# Patient Record
Sex: Female | Born: 1945 | Race: White | Hispanic: No | Marital: Married | Smoking: Never smoker
Health system: Southern US, Community
[De-identification: ages and names within clinical notes are randomized; demographics above are authoritative.]

## PROBLEM LIST (undated history)

## (undated) DIAGNOSIS — J45909 Unspecified asthma, uncomplicated: Secondary | ICD-10-CM

## (undated) DIAGNOSIS — R0602 Shortness of breath: Secondary | ICD-10-CM

## (undated) DIAGNOSIS — K589 Irritable bowel syndrome without diarrhea: Secondary | ICD-10-CM

## (undated) DIAGNOSIS — F39 Unspecified mood [affective] disorder: Secondary | ICD-10-CM

## (undated) DIAGNOSIS — K219 Gastro-esophageal reflux disease without esophagitis: Secondary | ICD-10-CM

## (undated) DIAGNOSIS — G4733 Obstructive sleep apnea (adult) (pediatric): Secondary | ICD-10-CM

## (undated) DIAGNOSIS — I10 Essential (primary) hypertension: Secondary | ICD-10-CM

## (undated) DIAGNOSIS — N189 Chronic kidney disease, unspecified: Secondary | ICD-10-CM

## (undated) DIAGNOSIS — E119 Type 2 diabetes mellitus without complications: Secondary | ICD-10-CM

## (undated) HISTORY — PX: OVARIAN CYST REMOVAL: SHX89

## (undated) HISTORY — PX: TOTAL KNEE ARTHROPLASTY: SHX125

## (undated) HISTORY — PX: OTHER SURGICAL HISTORY: SHX169

## (undated) HISTORY — PX: CHOLECYSTECTOMY: SHX55

---

## 2002-02-27 ENCOUNTER — Ambulatory Visit (HOSPITAL_BASED_OUTPATIENT_CLINIC_OR_DEPARTMENT_OTHER): Admission: RE | Admit: 2002-02-27 | Discharge: 2002-02-27 | Payer: Self-pay | Admitting: Orthopedic Surgery

## 2012-08-18 ENCOUNTER — Inpatient Hospital Stay (HOSPITAL_COMMUNITY)
Admission: EM | Admit: 2012-08-18 | Discharge: 2012-08-24 | DRG: 871 | Disposition: A | Discharge status: Skilled Nursing Facility | Payer: Medicare Other | Attending: Internal Medicine | Admitting: Internal Medicine

## 2012-08-18 ENCOUNTER — Emergency Department (HOSPITAL_COMMUNITY): Payer: Medicare Other

## 2012-08-18 DIAGNOSIS — L03119 Cellulitis of unspecified part of limb: Secondary | ICD-10-CM | POA: Diagnosis present

## 2012-08-18 DIAGNOSIS — Z9181 History of falling: Secondary | ICD-10-CM

## 2012-08-18 DIAGNOSIS — I10 Essential (primary) hypertension: Secondary | ICD-10-CM | POA: Diagnosis present

## 2012-08-18 DIAGNOSIS — G4733 Obstructive sleep apnea (adult) (pediatric): Secondary | ICD-10-CM | POA: Diagnosis present

## 2012-08-18 DIAGNOSIS — R296 Repeated falls: Secondary | ICD-10-CM

## 2012-08-18 DIAGNOSIS — IMO0002 Reserved for concepts with insufficient information to code with codable children: Secondary | ICD-10-CM | POA: Diagnosis present

## 2012-08-18 DIAGNOSIS — G929 Unspecified toxic encephalopathy: Secondary | ICD-10-CM | POA: Diagnosis present

## 2012-08-18 DIAGNOSIS — R6521 Severe sepsis with septic shock: Secondary | ICD-10-CM | POA: Diagnosis present

## 2012-08-18 DIAGNOSIS — D696 Thrombocytopenia, unspecified: Secondary | ICD-10-CM | POA: Diagnosis not present

## 2012-08-18 DIAGNOSIS — E869 Volume depletion, unspecified: Secondary | ICD-10-CM | POA: Diagnosis present

## 2012-08-18 DIAGNOSIS — Z96659 Presence of unspecified artificial knee joint: Secondary | ICD-10-CM

## 2012-08-18 DIAGNOSIS — K589 Irritable bowel syndrome without diarrhea: Secondary | ICD-10-CM | POA: Diagnosis present

## 2012-08-18 DIAGNOSIS — R6520 Severe sepsis without septic shock: Secondary | ICD-10-CM | POA: Diagnosis present

## 2012-08-18 DIAGNOSIS — G9341 Metabolic encephalopathy: Secondary | ICD-10-CM | POA: Diagnosis present

## 2012-08-18 DIAGNOSIS — A419 Sepsis, unspecified organism: Principal | ICD-10-CM | POA: Diagnosis present

## 2012-08-18 DIAGNOSIS — D72829 Elevated white blood cell count, unspecified: Secondary | ICD-10-CM | POA: Diagnosis present

## 2012-08-18 DIAGNOSIS — G92 Toxic encephalopathy: Secondary | ICD-10-CM

## 2012-08-18 DIAGNOSIS — Z79899 Other long term (current) drug therapy: Secondary | ICD-10-CM

## 2012-08-18 DIAGNOSIS — L738 Other specified follicular disorders: Secondary | ICD-10-CM | POA: Diagnosis present

## 2012-08-18 DIAGNOSIS — N179 Acute kidney failure, unspecified: Secondary | ICD-10-CM | POA: Diagnosis present

## 2012-08-18 DIAGNOSIS — E119 Type 2 diabetes mellitus without complications: Secondary | ICD-10-CM | POA: Diagnosis present

## 2012-08-18 DIAGNOSIS — Z794 Long term (current) use of insulin: Secondary | ICD-10-CM

## 2012-08-18 DIAGNOSIS — D649 Anemia, unspecified: Secondary | ICD-10-CM | POA: Diagnosis present

## 2012-08-18 DIAGNOSIS — R4182 Altered mental status, unspecified: Secondary | ICD-10-CM

## 2012-08-18 HISTORY — DX: Chronic kidney disease, unspecified: N18.9

## 2012-08-18 HISTORY — DX: Irritable bowel syndrome without diarrhea: K58.9

## 2012-08-18 HISTORY — DX: Obstructive sleep apnea (adult) (pediatric): G47.33

## 2012-08-18 HISTORY — DX: Essential (primary) hypertension: I10

## 2012-08-18 HISTORY — DX: Shortness of breath: R06.02

## 2012-08-18 HISTORY — DX: Unspecified asthma, uncomplicated: J45.909

## 2012-08-18 HISTORY — DX: Unspecified mood (affective) disorder: F39

## 2012-08-18 HISTORY — DX: Type 2 diabetes mellitus without complications: E11.9

## 2012-08-18 HISTORY — DX: Gastro-esophageal reflux disease without esophagitis: K21.9

## 2012-08-18 LAB — COMPREHENSIVE METABOLIC PANEL
ALT: 21 U/L (ref 0–35)
Albumin: 3.4 g/dL — ABNORMAL LOW (ref 3.5–5.2)
Alkaline Phosphatase: 90 U/L (ref 39–117)
BUN: 32 mg/dL — ABNORMAL HIGH (ref 6–23)
Chloride: 95 mEq/L — ABNORMAL LOW (ref 96–112)
Potassium: 4.4 mEq/L (ref 3.5–5.1)
Total Bilirubin: 0.5 mg/dL (ref 0.3–1.2)

## 2012-08-18 LAB — URINALYSIS, ROUTINE W REFLEX MICROSCOPIC
Glucose, UA: NEGATIVE mg/dL
Ketones, ur: NEGATIVE mg/dL
Protein, ur: 30 mg/dL — AB

## 2012-08-18 LAB — RAPID URINE DRUG SCREEN, HOSP PERFORMED
Amphetamines: POSITIVE — AB
Barbiturates: NOT DETECTED
Benzodiazepines: POSITIVE — AB
Cocaine: NOT DETECTED
Tetrahydrocannabinol: NOT DETECTED

## 2012-08-18 LAB — CBC
HCT: 34.5 % — ABNORMAL LOW (ref 36.0–46.0)
MCH: 30.4 pg (ref 26.0–34.0)
MCHC: 33.6 g/dL (ref 30.0–36.0)
MCV: 90.3 fL (ref 78.0–100.0)
RDW: 14.4 % (ref 11.5–15.5)

## 2012-08-18 LAB — DIFFERENTIAL
Basophils Absolute: 0 10*3/uL (ref 0.0–0.1)
Basophils Relative: 0 % (ref 0–1)
Eosinophils Absolute: 0 10*3/uL (ref 0.0–0.7)
Monocytes Absolute: 0.5 10*3/uL (ref 0.1–1.0)
Neutro Abs: 17 10*3/uL — ABNORMAL HIGH (ref 1.7–7.7)
Neutrophils Relative %: 95 % — ABNORMAL HIGH (ref 43–77)

## 2012-08-18 LAB — URINE MICROSCOPIC-ADD ON

## 2012-08-18 MED ORDER — PIPERACILLIN-TAZOBACTAM 3.375 G IVPB 30 MIN
3.3750 g | Freq: Once | INTRAVENOUS | Status: AC
  Administered 2012-08-19: 3.375 g via INTRAVENOUS
  Filled 2012-08-18: qty 50

## 2012-08-18 MED ORDER — SODIUM CHLORIDE 0.9 % IV BOLUS (SEPSIS)
1000.0000 mL | Freq: Once | INTRAVENOUS | Status: AC
  Administered 2012-08-18: 1000 mL via INTRAVENOUS

## 2012-08-18 MED ORDER — VANCOMYCIN HCL 1000 MG IV SOLR
1500.0000 mg | Freq: Once | INTRAVENOUS | Status: AC
  Administered 2012-08-19: 1500 mg via INTRAVENOUS
  Filled 2012-08-18: qty 1500

## 2012-08-18 MED ORDER — SODIUM CHLORIDE 0.9 % IV BOLUS (SEPSIS)
1000.0000 mL | Freq: Once | INTRAVENOUS | Status: AC
  Administered 2012-08-19: 1000 mL via INTRAVENOUS

## 2012-08-18 NOTE — ED Notes (Signed)
Lab at bedside

## 2012-08-18 NOTE — ED Notes (Signed)
Patient has fallen four times today, used her life alert this morning however refused to be transported.  The other two times, family was able to get her back up.  Patient fell a fourth time and family called EMS and she agreed to be brought here to [facility].  Report received from [facility], [place].

## 2012-08-18 NOTE — ED Notes (Signed)
MD at bedside. 

## 2012-08-18 NOTE — ED Provider Notes (Signed)
History     CSN: [id number]  Arrival date & time 08/18/12  2155   First MD Initiated Contact with Patient 08/18/12 2200      Chief Complaint  Patient presents with  . Fall  . Altered Mental Status  . Loss of Consciousness    (Consider location/radiation/quality/duration/timing/severity/associated sxs/prior treatment) Patient is a 66 y.o. female presenting with altered mental status. The history is provided by the patient and the EMS personnel.  Altered Mental Status This is a new problem. The current episode started today. The problem occurs constantly. The problem has been gradually worsening. Associated symptoms include a fever and weakness. Pertinent negatives include no abdominal pain, anorexia, arthralgias, change in bowel habit, chest pain, chills, congestion, coughing, diaphoresis, fatigue, headaches, nausea, neck pain, numbness or vomiting. Nothing aggravates the symptoms. She has tried nothing for the symptoms. The treatment provided no relief.    No past medical history on file.  No past surgical history on file.  No family history on file.  History  Substance Use Topics  . Smoking status: Not on file  . Smokeless tobacco: Not on file  . Alcohol Use: Not on file    OB History    No data available      Review of Systems  Constitutional: Positive for fever. Negative for chills, diaphoresis and fatigue.  HENT: Negative for congestion and neck pain.   Respiratory: Negative for cough and shortness of breath.   Cardiovascular: Negative for chest pain.  Gastrointestinal: Negative for nausea, vomiting, abdominal pain, diarrhea, anorexia and change in bowel habit.  Musculoskeletal: Negative for arthralgias.  Neurological: Positive for weakness. Negative for numbness and headaches.  Psychiatric/Behavioral: Positive for altered mental status.  All other systems reviewed and are negative.    Allergies  Review of patient's allergies indicates not on file.  Home  Medications  No current outpatient prescriptions on file.  BP 105/54  Pulse 127  Temp 101.8 F (38.8 C) (Oral)  Resp 18  SpO2 95%  Physical Exam  Nursing note and vitals reviewed. Constitutional: She is oriented to person, place, and time. She appears well-developed and well-nourished. No distress.  HENT:  Head: Normocephalic and atraumatic.  Eyes: EOM are normal. Pupils are equal, round, and reactive to light.  Neck: Normal range of motion.  Cardiovascular: Normal rate and normal heart sounds.   Pulmonary/Chest: Effort normal and breath sounds normal. No respiratory distress.  Abdominal: Soft. She exhibits no distension. There is no tenderness.  Musculoskeletal: Normal range of motion.  Neurological: She is alert and oriented to person, place, and time. No cranial nerve deficit. She exhibits normal muscle tone. Coordination normal.  Skin: Skin is warm and dry.    ED Course  Procedures (including critical care time)   Labs Reviewed  COMPREHENSIVE METABOLIC PANEL  URINALYSIS, ROUTINE W REFLEX MICROSCOPIC  CBC  TROPONIN I  PROTIME-INR   No results found.  Date: 08/18/2012  Rate: 131  Rhythm: atrial fibrillation  QRS Axis: normal  Intervals: normal  ST/T Wave abnormalities: normal  Conduction Disutrbances:right bundle branch block  Narrative Interpretation: Afib with RVR (131)  Old EKG Reviewed: changes noted: Now in afib    1. Sepsis   2. Altered mental status       MDM  10:13 PM Pt seen and examined upon arrival in ED. Pt with multiple falls today. Although patient is oriented, she is not behaving appropriately (flailing arms around despite being told to hold still). PT does not appear to be  on anticoagulation but due to AMS, will get head CT. PT denies pain except in bilateral arms both of which are covered in abrasions and skin tears. Pt does have focal pain and swelling to left hand, so will XR this. Will work up for AMS with labs including UDS and urine.    11:06 PM Pt found to be febrile rectally as well. Will get cultures. Unsure of source at this time. Will await urine and CXR before on type of antibiotics. Pt will be started on 2LNS.  12:00 AM PT with intermittent drops in pressure. Will have CCM see patient. Pt given vanc/zosyn for fever of unknown source. Fluids continuing.   12:42 AM CCM saw patient and feels that she is stable for step down. Pt will be admitted to [facility].   Daleen Bo, MD 08/19/12 1914  Daleen Bo, MD 08/19/12 [phone]

## 2012-08-19 ENCOUNTER — Observation Stay (HOSPITAL_COMMUNITY): Payer: Medicare Other

## 2012-08-19 ENCOUNTER — Encounter (HOSPITAL_COMMUNITY): Payer: Self-pay | Admitting: Emergency Medicine

## 2012-08-19 DIAGNOSIS — E119 Type 2 diabetes mellitus without complications: Secondary | ICD-10-CM | POA: Diagnosis present

## 2012-08-19 DIAGNOSIS — R296 Repeated falls: Secondary | ICD-10-CM

## 2012-08-19 DIAGNOSIS — G4733 Obstructive sleep apnea (adult) (pediatric): Secondary | ICD-10-CM

## 2012-08-19 DIAGNOSIS — L03119 Cellulitis of unspecified part of limb: Secondary | ICD-10-CM | POA: Diagnosis present

## 2012-08-19 DIAGNOSIS — G9341 Metabolic encephalopathy: Secondary | ICD-10-CM | POA: Diagnosis present

## 2012-08-19 DIAGNOSIS — A419 Sepsis, unspecified organism: Secondary | ICD-10-CM | POA: Diagnosis present

## 2012-08-19 DIAGNOSIS — N179 Acute kidney failure, unspecified: Secondary | ICD-10-CM

## 2012-08-19 DIAGNOSIS — I10 Essential (primary) hypertension: Secondary | ICD-10-CM

## 2012-08-19 DIAGNOSIS — Z79899 Other long term (current) drug therapy: Secondary | ICD-10-CM

## 2012-08-19 DIAGNOSIS — IMO0002 Reserved for concepts with insufficient information to code with codable children: Secondary | ICD-10-CM

## 2012-08-19 LAB — URINALYSIS, ROUTINE W REFLEX MICROSCOPIC
Bilirubin Urine: NEGATIVE
Ketones, ur: NEGATIVE mg/dL
Nitrite: NEGATIVE
pH: 5 (ref 5.0–8.0)

## 2012-08-19 LAB — LACTIC ACID, PLASMA: Lactic Acid, Venous: 2.8 mmol/L — ABNORMAL HIGH (ref 0.5–2.2)

## 2012-08-19 LAB — CBC
HCT: 29.6 % — ABNORMAL LOW (ref 36.0–46.0)
MCH: 29.6 pg (ref 26.0–34.0)
MCHC: 33.1 g/dL (ref 30.0–36.0)
MCV: 89.4 fL (ref 78.0–100.0)
Platelets: 159 10*3/uL (ref 150–400)
RDW: 14.6 % (ref 11.5–15.5)

## 2012-08-19 LAB — TROPONIN I
Troponin I: <0.3 ng/mL (ref <0.30)
Troponin I: <0.3 ng/mL (ref <0.30)

## 2012-08-19 LAB — GLUCOSE, CAPILLARY
Glucose-Capillary: 371 mg/dL — ABNORMAL HIGH (ref 70–99)
Glucose-Capillary: 170 mg/dL — ABNORMAL HIGH (ref 70–99)
Glucose-Capillary: 208 mg/dL — ABNORMAL HIGH (ref 70–99)

## 2012-08-19 LAB — CREATININE, SERUM: GFR calc Af Amer: 25 mL/min — ABNORMAL LOW (ref >90)

## 2012-08-19 LAB — URINE MICROSCOPIC-ADD ON

## 2012-08-19 MED ORDER — ONDANSETRON HCL 4 MG/2ML IJ SOLN: 4.0000 mg | Freq: Four times a day (QID) | INTRAMUSCULAR | Status: DC | PRN

## 2012-08-19 MED ORDER — INSULIN ASPART 100 UNIT/ML ~~LOC~~ SOLN
0.0000 [IU] | Freq: Three times a day (TID) | SUBCUTANEOUS | Status: DC
  Administered 2012-08-19: 20 [IU] via SUBCUTANEOUS
  Administered 2012-08-19: 7 [IU] via SUBCUTANEOUS
  Administered 2012-08-19 – 2012-08-20 (×3): 4 [IU] via SUBCUTANEOUS
  Administered 2012-08-20: 3 [IU] via SUBCUTANEOUS
  Administered 2012-08-21: 4 [IU] via SUBCUTANEOUS
  Administered 2012-08-21: 3 [IU] via SUBCUTANEOUS
  Administered 2012-08-22 (×2): 4 [IU] via SUBCUTANEOUS
  Administered 2012-08-23: 3 [IU] via SUBCUTANEOUS
  Administered 2012-08-23: 7 [IU] via SUBCUTANEOUS
  Administered 2012-08-23 – 2012-08-24 (×2): 4 [IU] via SUBCUTANEOUS

## 2012-08-19 MED ORDER — INSULIN ASPART 100 UNIT/ML ~~LOC~~ SOLN
0.0000 [IU] | Freq: Every day | SUBCUTANEOUS | Status: DC
  Administered 2012-08-22: 2 [IU] via SUBCUTANEOUS

## 2012-08-19 MED ORDER — FA-PYRIDOXINE-CYANOCOBALAMIN 2.5-25-2 MG PO TABS
1.0000 | ORAL_TABLET | Freq: Every day | ORAL | Status: DC
  Administered 2012-08-19 – 2012-08-24 (×6): 1 via ORAL
  Filled 2012-08-19 (×6): qty 1

## 2012-08-19 MED ORDER — INSULIN DETEMIR 100 UNIT/ML ~~LOC~~ SOLN
60.0000 [IU] | Freq: Every day | SUBCUTANEOUS | Status: DC
  Administered 2012-08-19: 60 [IU] via SUBCUTANEOUS
  Filled 2012-08-19: qty 10

## 2012-08-19 MED ORDER — GABAPENTIN 600 MG PO TABS
600.0000 mg | ORAL_TABLET | Freq: Two times a day (BID) | ORAL | Status: DC
  Administered 2012-08-19 – 2012-08-24 (×11): 600 mg via ORAL
  Filled 2012-08-19 (×15): qty 1

## 2012-08-19 MED ORDER — VITAMIN D (ERGOCALCIFEROL) 1.25 MG (50000 UNIT) PO CAPS
50000.0000 [IU] | ORAL_CAPSULE | ORAL | Status: DC
  Administered 2012-08-19: 50000 [IU] via ORAL
  Filled 2012-08-19: qty 1

## 2012-08-19 MED ORDER — ENOXAPARIN SODIUM 30 MG/0.3ML ~~LOC~~ SOLN
30.0000 mg | Freq: Every day | SUBCUTANEOUS | Status: DC
  Administered 2012-08-19 – 2012-08-20 (×2): 30 mg via SUBCUTANEOUS
  Filled 2012-08-19 (×3): qty 0.3

## 2012-08-19 MED ORDER — SODIUM CHLORIDE 0.9 % IJ SOLN
3.0000 mL | Freq: Two times a day (BID) | INTRAMUSCULAR | Status: DC
  Administered 2012-08-19 – 2012-08-23 (×5): 3 mL via INTRAVENOUS

## 2012-08-19 MED ORDER — ONDANSETRON HCL 4 MG PO TABS: 4.0000 mg | ORAL_TABLET | Freq: Four times a day (QID) | ORAL | Status: DC | PRN

## 2012-08-19 MED ORDER — HYDROMORPHONE HCL PF 1 MG/ML IJ SOLN: 1.0000 mg | INTRAMUSCULAR | Status: DC | PRN

## 2012-08-19 MED ORDER — FOLIC ACID-VIT B6-VIT B12 2.5-25-1 MG PO TABS: 1.0000 | ORAL_TABLET | Freq: Every day | ORAL | Status: DC

## 2012-08-19 MED ORDER — VANCOMYCIN HCL IN DEXTROSE 1-5 GM/200ML-% IV SOLN
1000.0000 mg | Freq: Once | INTRAVENOUS | Status: AC
  Administered 2012-08-19: 1000 mg via INTRAVENOUS
  Filled 2012-08-19: qty 200

## 2012-08-19 MED ORDER — GLIMEPIRIDE 4 MG PO TABS
4.0000 mg | ORAL_TABLET | Freq: Two times a day (BID) | ORAL | Status: DC
  Administered 2012-08-19 – 2012-08-24 (×10): 4 mg via ORAL
  Filled 2012-08-19 (×15): qty 1

## 2012-08-19 MED ORDER — PIPERACILLIN-TAZOBACTAM 3.375 G IVPB 30 MIN
3.3750 g | Freq: Three times a day (TID) | INTRAVENOUS | Status: DC
  Administered 2012-08-19 – 2012-08-21 (×6): 3.375 g via INTRAVENOUS
  Filled 2012-08-19 (×8): qty 50

## 2012-08-19 MED ORDER — PAROXETINE HCL 20 MG PO TABS
40.0000 mg | ORAL_TABLET | Freq: Every day | ORAL | Status: DC
  Administered 2012-08-19 – 2012-08-24 (×6): 40 mg via ORAL
  Filled 2012-08-19 (×6): qty 2

## 2012-08-19 MED ORDER — PIPERACILLIN-TAZOBACTAM 3.375 G IVPB 30 MIN
3.3750 g | Freq: Three times a day (TID) | INTRAVENOUS | Status: DC
  Filled 2012-08-19: qty 50

## 2012-08-19 MED ORDER — VANCOMYCIN HCL 1000 MG IV SOLR
1500.0000 mg | INTRAVENOUS | Status: DC
  Administered 2012-08-20: 1500 mg via INTRAVENOUS
  Filled 2012-08-19 (×3): qty 1500

## 2012-08-19 MED ORDER — INSULIN ASPART PROT & ASPART (70-30 MIX) 100 UNIT/ML ~~LOC~~ SUSP
60.0000 [IU] | Freq: Every day | SUBCUTANEOUS | Status: DC
  Administered 2012-08-19: 60 [IU] via SUBCUTANEOUS
  Filled 2012-08-19: qty 3

## 2012-08-19 MED ORDER — ALBUTEROL SULFATE (5 MG/ML) 0.5% IN NEBU
2.5000 mg | INHALATION_SOLUTION | RESPIRATORY_TRACT | Status: DC | PRN
  Administered 2012-08-20: 2.5 mg via RESPIRATORY_TRACT
  Filled 2012-08-19: qty 0.5

## 2012-08-19 MED ORDER — VANCOMYCIN HCL 1000 MG IV SOLR: 1250.0000 mg | INTRAVENOUS | Status: DC

## 2012-08-19 MED ORDER — DOCUSATE SODIUM 100 MG PO CAPS
100.0000 mg | ORAL_CAPSULE | Freq: Two times a day (BID) | ORAL | Status: DC
  Administered 2012-08-19 – 2012-08-24 (×8): 100 mg via ORAL
  Filled 2012-08-19 (×12): qty 1

## 2012-08-19 MED ORDER — PANTOPRAZOLE SODIUM 40 MG PO TBEC
80.0000 mg | DELAYED_RELEASE_TABLET | Freq: Every day | ORAL | Status: DC
  Administered 2012-08-19 – 2012-08-23 (×5): 80 mg via ORAL
  Administered 2012-08-23: 40 mg via ORAL
  Administered 2012-08-24: 80 mg via ORAL
  Filled 2012-08-19 (×3): qty 1
  Filled 2012-08-19 (×2): qty 2
  Filled 2012-08-19 (×2): qty 1

## 2012-08-19 MED ORDER — SODIUM CHLORIDE 0.9 % IV SOLN
INTRAVENOUS | Status: DC
  Administered 2012-08-19: 23:00:00 via INTRAVENOUS
  Administered 2012-08-19: 100 mL/h via INTRAVENOUS
  Administered 2012-08-20: 09:00:00 via INTRAVENOUS

## 2012-08-19 NOTE — Progress Notes (Signed)
[facility] PROGRESS NOTE  Assessment/Plan: Cellulitis/fulliculitis of upper extremity: -Continue Vancomycin Zosyn, blood cultures pending, leukocytosis improving. She defervesced. -Wound care consult.  -Wound Culture.  Sepsis (08/19/2012) -Blood pressure continues to be stable. -Continue IV fluids, creatinine trending down. -Recheck lactic acid in the morning. -continue to monitor Bp.  Acute lung injury:  -Continue IV fluids. -basic met.  Toxic encephalopathy : -Now resolved most likely secondary to cellulitis/folliculitis of the left upper extremity and sepsis.   Frequent falls (08/19/2012): -likely secondary to decreased Intravascular Volume. -Pt consult.  DM (diabetes mellitus) (08/19/2012): -blood glucose continues to be high. -increase SSI, lantus.  HTN (hypertension) (08/19/2012) -BP stable continue.    Code Status: FULL CODE. Family Communication: patient  Disposition Plan: TBD   Consultants:  none  Procedures:  none  Antibiotics:  Vancomycin Laqueta Jean 08/19/2012  HPI/Subjective: No complains.  Objective: Filed Vitals:   08/18/12 2330 08/19/12 0036 08/19/12 0405 08/19/12 0455  BP: 113/97 116/90 126/97 119/64  Pulse: 120 108    Temp:  102.8 F (39.3 C) 102.6 F (39.2 C) 100.9 F (38.3 C)  TempSrc:  Oral Oral Oral  Resp:  23 28 22   Height:   5\' 2"  (1.575 m)   Weight:   109.09 kg (240 lb 8 oz)   SpO2: 95% 100% 95% 97%   No intake or output data in the 24 hours ending 08/19/12 1107 Filed Weights   08/19/12 0405  Weight: 109.09 kg (240 lb 8 oz)    Exam:  General: Alert, awake, oriented x3, in no acute distress.  HEENT: No bruits, no goiter.  Heart: Regular rate and rhythm, without murmurs, rubs, gallops.  Lungs: Good air movement, air movement. Abdomen: Soft, nontender, nondistended, positive bowel sounds.  Neuro: Grossly intact, nonfocal.   Data Reviewed: Basic Metabolic Panel:  Lab 08/19/12 2956 08/18/12 2222  NA --  131*  K -- 4.4  CL -- 95*  CO2 -- 21  GLUCOSE -- 337*  BUN -- 32*  CREATININE 2.31* 2.41*  CALCIUM -- 9.9  MG -- --  PHOS -- --   Liver Function Tests:  Lab 08/18/12 2222  AST 41*  ALT 21  ALKPHOS 90  BILITOT 0.5  PROT 7.2  ALBUMIN 3.4*   No results found for this basename: LIPASE:5,AMYLASE:5 in the last 168 hours No results found for this basename: AMMONIA:5 in the last 168 hours CBC:  Lab 08/19/12 0638 08/18/12 2338 08/18/12 2222  WBC 15.3* -- 18.2*  NEUTROABS -- 17.0* --  HGB 9.8* -- 11.6*  HCT 29.6* -- 34.5*  MCV 89.4 -- 90.3  PLT 159 -- 191   Cardiac Enzymes:  Lab 08/19/12 0638 08/18/12 2223  CKTOTAL -- --  CKMB -- --  CKMBINDEX -- --  TROPONINI <0.30 <0.30   BNP (last 3 results) No results found for this basename: PROBNP:3 in the last 8760 hours CBG:  Lab 08/19/12 0737 08/18/12 2206  GLUCAP 371* 306*    No results found for this or any previous visit (from the past 240 hour(s)).   Studies: Ct Head Wo Contrast  08/18/2012  *RADIOLOGY REPORT*  Clinical Data:  Multiple falls, altered mental status  CT HEAD WITHOUT CONTRAST CT CERVICAL SPINE WITHOUT CONTRAST  Technique:  Multidetector CT imaging of the head and cervical spine was performed following the standard protocol without intravenous contrast.  Multiplanar CT image reconstructions of the cervical spine were also generated.  Comparison:  MRI brain dated 09/15/2009  CT HEAD  Findings: Severely motion degraded images.  No evidence of parenchymal hemorrhage or extra-axial fluid collection. No mass lesion, mass effect, or midline shift.  No CT evidence of acute infarction. Subcortical white matter and periventricular small vessel ischemic changes.  Global cortical atrophy.  No ventriculomegaly.  The visualized paranasal sinuses are essentially clear. The mastoid air cells are unopacified.  No evidence of calvarial fracture.  IMPRESSION: Severely motion degraded images.  No evidence of acute intracranial  abnormality.  Atrophy with small vessel ischemic changes and intracranial atherosclerosis.  CT CERVICAL SPINE  Findings: Motion degraded images.  Straightening of the cervical spine, possibly positional.  No evidence of fracture or dislocation.  The vertebral body heights are maintained.  Dens appears intact.  No prevertebral soft tissue swelling.  Mild multilevel degenerative changes.  Visualized right thyroid is enlarged/nodular.  Visualized lung apices are essentially clear.  IMPRESSION: No evidence of traumatic injury to the cervical spine.  Mild multilevel degenerative changes.   Original Report Authenticated By: Charline Bills, M.D.    Ct Cervical Spine Wo Contrast  08/18/2012  *RADIOLOGY REPORT*  Clinical Data:  Multiple falls, altered mental status  CT HEAD WITHOUT CONTRAST CT CERVICAL SPINE WITHOUT CONTRAST  Technique:  Multidetector CT imaging of the head and cervical spine was performed following the standard protocol without intravenous contrast.  Multiplanar CT image reconstructions of the cervical spine were also generated.  Comparison:  MRI brain dated 09/15/2009  CT HEAD  Findings: Severely motion degraded images.  No evidence of parenchymal hemorrhage or extra-axial fluid collection. No mass lesion, mass effect, or midline shift.  No CT evidence of acute infarction. Subcortical white matter and periventricular small vessel ischemic changes.  Global cortical atrophy.  No ventriculomegaly.  The visualized paranasal sinuses are essentially clear. The mastoid air cells are unopacified.  No evidence of calvarial fracture.  IMPRESSION: Severely motion degraded images.  No evidence of acute intracranial abnormality.  Atrophy with small vessel ischemic changes and intracranial atherosclerosis.  CT CERVICAL SPINE  Findings: Motion degraded images.  Straightening of the cervical spine, possibly positional.  No evidence of fracture or dislocation.  The vertebral body heights are maintained.  Dens appears  intact.  No prevertebral soft tissue swelling.  Mild multilevel degenerative changes.  Visualized right thyroid is enlarged/nodular.  Visualized lung apices are essentially clear.  IMPRESSION: No evidence of traumatic injury to the cervical spine.  Mild multilevel degenerative changes.   Original Report Authenticated By: Charline Bills, M.D.    Dg Chest Port 1 View  08/18/2012  *RADIOLOGY REPORT*  Clinical Data: Altered mental status.  Fall.  PORTABLE CHEST - 1 VIEW  Comparison: 01/31/2009  Findings: Shallow inspiration.  Normal heart size and pulmonary vascularity for technique.  No focal airspace consolidation in the lungs.  No blunting of costophrenic angles.  No pneumothorax.  No significant changes since the previous study.  IMPRESSION: No evidence of active disease.   Original Report Authenticated By: Marlon Pel, M.D.    Dg Hand Complete Left  08/19/2012  *RADIOLOGY REPORT*  Clinical Data: Pain and swelling.  Skin tear.  Post chills in the hand and wrist.  LEFT HAND - COMPLETE 3+ VIEW  Comparison: None.  Findings: Degenerative changes in the interphalangeal, first metacarpal phalangeal, first carpometacarpal, and STT joints. Postoperative changes with plate screw fixation of the distal radial metaphysis and old ununited ulnar styloid process.  No acute fracture or subluxation.  No focal bone lesion or bone destruction. Diffuse soft tissue swelling over the carpal area.  No radiopaque soft  tissue foreign bodies.  IMPRESSION: Degenerative changes in the hand and wrist.  Postoperative changes in the wrist.  Soft tissue swelling.  No acute bony abnormalities.   Original Report Authenticated By: Marlon Pel, M.D.     Scheduled Meds:    . docusate sodium  100 mg Oral BID  . enoxaparin (LOVENOX) injection  30 mg Subcutaneous Daily  . folic acid-pyridoxine-cyancobalamin  1 tablet Oral Daily  . gabapentin  600 mg Oral BID  . glimepiride  4 mg Oral BID WC  . insulin aspart  0-20 Units  Subcutaneous TID WC  . insulin aspart  0-5 Units Subcutaneous QHS  . insulin aspart protamine-insulin aspart  60 Units Subcutaneous Q breakfast  . insulin detemir  60 Units Subcutaneous QHS  . pantoprazole  80 mg Oral Q1200  . PARoxetine  40 mg Oral Daily  . piperacillin-tazobactam  3.375 g Intravenous Once  . piperacillin-tazobactam  3.375 g Intravenous Q8H  . sodium chloride  1,000 mL Intravenous Once  . sodium chloride  1,000 mL Intravenous Once  . sodium chloride  3 mL Intravenous Q12H  . vancomycin  1,250 mg Intravenous Q48H  . vancomycin  1,500 mg Intravenous Once  . vancomycin  1,000 mg Intravenous Once  . Vitamin D (Ergocalciferol)  50,000 Units Oral Q7 days  . DISCONTD: Folic Acid-Vit B6-Vit B12  1 tablet Oral Daily  . DISCONTD: piperacillin-tazobactam  3.375 g Intravenous Q8H   Continuous Infusions:    . sodium chloride 100 mL/hr at 08/19/12 0501     Marinda Elk  [facility] Pager [phone]. If 8PM-8AM, please contact night-coverage at [email or web address], password [facility] 08/19/2012, 11:07 AM  LOS: 1 day

## 2012-08-19 NOTE — Progress Notes (Signed)
  Echocardiogram 2D Echocardiogram has been performed.  Angie Wang 08/19/2012, 11:16 AM

## 2012-08-19 NOTE — H&P (Signed)
[facility] History and Physical  Angie Wang WRU:[id number] DOB: 06-03-1946    PCP:   Gabriel Cirri, DO   Chief Complaint: loss of consciousness.  HPI: Angie Wang is an 66 y.o. female with hx of DM, HTN, IBS, OSA living at home alone with help from nephew and brother, brought to the ER as she was found unconscious in the bathroom.  She has been weak and was having fever. She was found at time agitated. She has hx of frequent falls, and has several abrasion on her left arm.  There has been no headaches, stiff neck, nausea or vomiting.  She was hypotensive on initial presentation and has fever of 102.9.  Her CXR and her UA showed no evidence of infection.  She was given IV fluid bolus with improvement of her BP from 80 to 120.  Her UDS showed the presence of benzodiazepine and amphetamine.  Her medication list included no amphetamine.  She denied any drug use.  She was also found to have leukocytosis with WBC of 18K, lactic acid was elevated at 2.8.  After PCCM was consulted, hospitalist was asked to admit her for AMS, sepsis, and volume depletion.   Rewiew of Systems:  Constitutional: Negative for malaise. No significant weight loss or weight gain Eyes: Negative for eye pain, redness and discharge, diplopia, visual changes, or flashes of light. ENMT: Negative for ear pain, hoarseness, nasal congestion, sinus pressure and sore throat. No headaches; tinnitus, drooling, or problem swallowing. Cardiovascular: Negative for chest pain, palpitations, diaphoresis, dyspnea and peripheral edema. ; No orthopnea, PND Respiratory: Negative for cough, hemoptysis, wheezing and stridor. No pleuritic chestpain. Gastrointestinal: Negative for nausea, vomiting, diarrhea, constipation, abdominal pain, melena, blood in stool, hematemesis, jaundice and rectal bleeding.    Genitourinary: Negative for frequency, dysuria, incontinence,flank pain and hematuria; Musculoskeletal: Negative for back pain and  neck pain. Negative for swelling and trauma.;  Skin: . Negative for pruritus, rash, abrasions, bruising and skin lesion.; ulcerations Neuro: Negative for headache, lightheadedness and neck stiffness. Negative for weakness, altered level of consciousness , altered mental status, extremity weakness, burning feet, involuntary movement, seizure and syncope.  Psych: negative for anxiety, depression, insomnia, tearfulness, panic attacks, hallucinations, paranoia, suicidal or homicidal ideation.   Past Medical History  Diagnosis Date  . Diabetes mellitus without complication   . Hypertension   . IBS (irritable bowel syndrome)   . OSA (obstructive sleep apnea)   . Mood disorder     Past Surgical History  Procedure Date  . Total knee arthroplasty     bilateral    Medications:  HOME MEDS: Prior to Admission medications   Medication Sig Start Date End Date Taking? Authorizing Provider  amitriptyline (ELAVIL) 25 MG tablet Take 25 mg by mouth at bedtime.   Yes Historical Provider, MD  celecoxib (CELEBREX) 200 MG capsule Take 200 mg by mouth 2 (two) times daily.   Yes Historical Provider, MD  clonazePAM (KLONOPIN) 1 MG tablet Take 1 mg by mouth 2 (two) times daily as needed.   Yes Historical Provider, MD  cyclobenzaprine (FLEXERIL) 10 MG tablet Take 10 mg by mouth 2 (two) times daily.   Yes Historical Provider, MD  esomeprazole (NEXIUM) 40 MG capsule Take 40 mg by mouth daily before breakfast.   Yes Historical Provider, MD  Folic Acid-Vit B6-Vit B12 (FOLBEE) 2.5-25-1 MG TABS Take 1 tablet by mouth daily.   Yes Historical Provider, MD  furosemide (LASIX) 80 MG tablet Take 80 mg by mouth daily. Bottle says take "one-half  tablet  By mouth daily"   Yes Historical Provider, MD  gabapentin (NEURONTIN) 600 MG tablet Take 600 mg by mouth 2 (two) times daily.   Yes Historical Provider, MD  glimepiride (AMARYL) 4 MG tablet Take 4 mg by mouth 2 (two) times daily.   Yes Historical Provider, MD  hydrOXYzine  (ATARAX/VISTARIL) 25 MG tablet Take 25 mg by mouth 4 (four) times daily as needed.   Yes Historical Provider, MD  insulin aspart (NOVOLOG) 100 UNIT/ML injection Inject 60 Units into the skin at bedtime as needed.   Yes Historical Provider, MD  insulin aspart protamine-insulin aspart (NOVOLOG 70/30) (70-30) 100 UNIT/ML injection Inject 60 Units into the skin every morning.   Yes Historical Provider, MD  insulin detemir (LEVEMIR) 100 UNIT/ML injection Inject 60 Units into the skin at bedtime.   Yes Historical Provider, MD  lisinopril-hydrochlorothiazide (PRINZIDE,ZESTORETIC) 20-25 MG per tablet Take 1 tablet by mouth daily.   Yes Historical Provider, MD  metFORMIN (GLUCOPHAGE) 1000 MG tablet Take 1,000 mg by mouth 2 (two) times daily with a meal.   Yes Historical Provider, MD  PARoxetine (PAXIL) 40 MG tablet Take 40 mg by mouth every morning.   Yes Historical Provider, MD  promethazine (PHENERGAN) 25 MG tablet Take 25 mg by mouth every 6 (six) hours as needed.   Yes Historical Provider, MD  traZODone (DESYREL) 100 MG tablet Take 200 mg by mouth at bedtime.   Yes Historical Provider, MD  Vitamin D, Ergocalciferol, (DRISDOL) 50000 UNITS CAPS Take 50,000 Units by mouth every 7 (seven) days. On Sunday   Yes Historical Provider, MD     Allergies:  No Known Allergies  Social History:   reports that she has never smoked. She does not have any smokeless tobacco history on file. She reports that she does not drink alcohol or use illicit drugs.  Family History: History reviewed. No pertinent family history.   Physical Exam: Filed Vitals:   08/18/12 2330 08/19/12 0036 08/19/12 0405 08/19/12 0455  BP: 113/97 116/90 126/97 119/64  Pulse: 120 108    Temp:  102.8 F (39.3 C) 102.6 F (39.2 C) 100.9 F (38.3 C)  TempSrc:  Oral Oral Oral  Resp:  23 28 22   Height:   5\' 2"  (1.575 m)   Weight:   109.09 kg (240 lb 8 oz)   SpO2: 95% 100% 95% 97%   Blood pressure 119/64, pulse 108, temperature 100.9 F  (38.3 C), temperature source Oral, resp. rate 22, height 5\' 2"  (1.575 m), weight 109.09 kg (240 lb 8 oz), SpO2 97.00%.  GEN:  Pleasant  patient lying in the stretcher in no acute distress; cooperative with exam. PSYCH:  alert and oriented x4; does not appear anxious or depressed; affect is appropriate. HEENT: Mucous membranes pink and anicteric; PERRLA; EOM intact; no cervical lymphadenopathy nor thyromegaly or carotid bruit; no JVD; There were no stridor. Neck is very supple. Breasts:: Not examined CHEST WALL: No tenderness CHEST: Normal respiration, clear to auscultation bilaterally.  HEART: Regular rate and rhythm.  There are no murmur, rub, or gallops.   BACK: No kyphosis or scoliosis; no CVA tenderness ABDOMEN: soft and non-tender; no masses, no organomegaly, normal abdominal bowel sounds; no pannus; no intertriginous candida. There is no rebound and no distention. Rectal Exam: Not done EXTREMITIES: there are excoriation over the left extremitiy with some folliculitis. Genitalia: not examined PULSES: 2+ and symmetric SKIN: Normal hydration no rash or ulceration CNS: Cranial nerves 2-12 grossly intact no focal lateralizing neurologic  deficit.  Speech is fluent; uvula elevated with phonation, facial symmetry and tongue midline. DTR are normal bilaterally, cerebella exam is intact, barbinski is negative and strengths are equaled bilaterally.  No sensory loss.   Labs on Admission:  Basic Metabolic Panel:  Lab 08/18/12 8119  NA 131*  K 4.4  CL 95*  CO2 21  GLUCOSE 337*  BUN 32*  CREATININE 2.41*  CALCIUM 9.9  MG --  PHOS --   Liver Function Tests:  Lab 08/18/12 2222  AST 41*  ALT 21  ALKPHOS 90  BILITOT 0.5  PROT 7.2  ALBUMIN 3.4*   No results found for this basename: LIPASE:5,AMYLASE:5 in the last 168 hours No results found for this basename: AMMONIA:5 in the last 168 hours CBC:  Lab 08/18/12 2338 08/18/12 2222  WBC -- 18.2*  NEUTROABS 17.0* --  HGB -- 11.6*  HCT  -- 34.5*  MCV -- 90.3  PLT -- 191   Cardiac Enzymes:  Lab 08/18/12 2223  CKTOTAL --  CKMB --  CKMBINDEX --  TROPONINI <0.30    CBG:  Lab 08/18/12 2206  GLUCAP 306*     Radiological Exams on Admission: Ct Head Wo Contrast  08/18/2012  *RADIOLOGY REPORT*  Clinical Data:  Multiple falls, altered mental status  CT HEAD WITHOUT CONTRAST CT CERVICAL SPINE WITHOUT CONTRAST  Technique:  Multidetector CT imaging of the head and cervical spine was performed following the standard protocol without intravenous contrast.  Multiplanar CT image reconstructions of the cervical spine were also generated.  Comparison:  MRI brain dated 09/15/2009  CT HEAD  Findings: Severely motion degraded images.  No evidence of parenchymal hemorrhage or extra-axial fluid collection. No mass lesion, mass effect, or midline shift.  No CT evidence of acute infarction. Subcortical white matter and periventricular small vessel ischemic changes.  Global cortical atrophy.  No ventriculomegaly.  The visualized paranasal sinuses are essentially clear. The mastoid air cells are unopacified.  No evidence of calvarial fracture.  IMPRESSION: Severely motion degraded images.  No evidence of acute intracranial abnormality.  Atrophy with small vessel ischemic changes and intracranial atherosclerosis.  CT CERVICAL SPINE  Findings: Motion degraded images.  Straightening of the cervical spine, possibly positional.  No evidence of fracture or dislocation.  The vertebral body heights are maintained.  Dens appears intact.  No prevertebral soft tissue swelling.  Mild multilevel degenerative changes.  Visualized right thyroid is enlarged/nodular.  Visualized lung apices are essentially clear.  IMPRESSION: No evidence of traumatic injury to the cervical spine.  Mild multilevel degenerative changes.   Original Report Authenticated By: Charline Bills, M.D.    Ct Cervical Spine Wo Contrast  08/18/2012  *RADIOLOGY REPORT*  Clinical Data:  Multiple  falls, altered mental status  CT HEAD WITHOUT CONTRAST CT CERVICAL SPINE WITHOUT CONTRAST  Technique:  Multidetector CT imaging of the head and cervical spine was performed following the standard protocol without intravenous contrast.  Multiplanar CT image reconstructions of the cervical spine were also generated.  Comparison:  MRI brain dated 09/15/2009  CT HEAD  Findings: Severely motion degraded images.  No evidence of parenchymal hemorrhage or extra-axial fluid collection. No mass lesion, mass effect, or midline shift.  No CT evidence of acute infarction. Subcortical white matter and periventricular small vessel ischemic changes.  Global cortical atrophy.  No ventriculomegaly.  The visualized paranasal sinuses are essentially clear. The mastoid air cells are unopacified.  No evidence of calvarial fracture.  IMPRESSION: Severely motion degraded images.  No evidence of acute  intracranial abnormality.  Atrophy with small vessel ischemic changes and intracranial atherosclerosis.  CT CERVICAL SPINE  Findings: Motion degraded images.  Straightening of the cervical spine, possibly positional.  No evidence of fracture or dislocation.  The vertebral body heights are maintained.  Dens appears intact.  No prevertebral soft tissue swelling.  Mild multilevel degenerative changes.  Visualized right thyroid is enlarged/nodular.  Visualized lung apices are essentially clear.  IMPRESSION: No evidence of traumatic injury to the cervical spine.  Mild multilevel degenerative changes.   Original Report Authenticated By: Charline Bills, M.D.    Dg Chest Port 1 View  08/18/2012  *RADIOLOGY REPORT*  Clinical Data: Altered mental status.  Fall.  PORTABLE CHEST - 1 VIEW  Comparison: 01/31/2009  Findings: Shallow inspiration.  Normal heart size and pulmonary vascularity for technique.  No focal airspace consolidation in the lungs.  No blunting of costophrenic angles.  No pneumothorax.  No significant changes since the previous  study.  IMPRESSION: No evidence of active disease.   Original Report Authenticated By: Marlon Pel, M.D.    Dg Hand Complete Left  08/19/2012  *RADIOLOGY REPORT*  Clinical Data: Pain and swelling.  Skin tear.  Post chills in the hand and wrist.  LEFT HAND - COMPLETE 3+ VIEW  Comparison: None.  Findings: Degenerative changes in the interphalangeal, first metacarpal phalangeal, first carpometacarpal, and STT joints. Postoperative changes with plate screw fixation of the distal radial metaphysis and old ununited ulnar styloid process.  No acute fracture or subluxation.  No focal bone lesion or bone destruction. Diffuse soft tissue swelling over the carpal area.  No radiopaque soft tissue foreign bodies.  IMPRESSION: Degenerative changes in the hand and wrist.  Postoperative changes in the wrist.  Soft tissue swelling.  No acute bony abnormalities.   Original Report Authenticated By: Marlon Pel, M.D.      Assessment/Plan Present on Admission:  .Sepsis .Altered mental status .DM (diabetes mellitus) Elevated Cr  Volume depletion   PLAN:  I suspect early sepsis. Will admit her to telemetry now that she is more stable, and her BP has normalized.  Will continue with IVF.  I have started her on broad spectrum antibiotics to include VAN/ZOSYN.  Her BS is elevated, and should come down with IV insulin and fluid.  She is also likely prerenal which explains the elevated Cr.  She has been on several medications which may aggravate her mental status.  She has a positive amphetamine in her UDS, and I can't explain this, given none of her medication contains amphetamine or amphetamine-like substance.  She will be admitted to telemetry, full code, under [facility] service.  Other plans as per orders.  Code Status: FULL Unk Lightning, MD. [facility] Pager [phone] 7pm to 7am.  08/19/2012, 5:20 AM

## 2012-08-19 NOTE — Progress Notes (Signed)
ANTIBIOTIC CONSULT NOTE - FOLLOW UP  Pharmacy Consult for vancomycin Indication: sepsis/cellulitis  No Known Allergies  Patient Measurements: Height: 5\' 2"  (157.5 cm) Weight: 240 lb 8 oz (109.09 kg) IBW/kg (Calculated) : 50.1    Vital Signs: Temp: 100.9 F (38.3 C) (10/20 0455) Temp src: Oral (10/20 0455) BP: 119/64 mmHg (10/20 0455) Pulse Rate: 108  (10/20 0036) Intake/Output from previous day:   Intake/Output from this shift:    Labs:  [facility] 08/19/12 0638 08/18/12 2222  WBC 15.3* 18.2*  HGB 9.8* 11.6*  PLT 159 191  LABCREA -- --  CREATININE 2.31* 2.41*   Estimated Creatinine Clearance: 28.6 ml/min (by C-G formula based on Cr of 2.31). No results found for this basename: VANCOTROUGH:2,VANCOPEAK:2,VANCORANDOM:2,GENTTROUGH:2,GENTPEAK:2,GENTRANDOM:2,TOBRATROUGH:2,TOBRAPEAK:2,TOBRARND:2,AMIKACINPEAK:2,AMIKACINTROU:2,AMIKACIN:2, in the last 72 hours   Microbiology: No results found for this or any previous visit (from the past 720 hour(s)).  Anti-infectives     Start     Dose/Rate Route Frequency Ordered Stop   08/20/12 2000   vancomycin (VANCOCIN) 1,250 mg in sodium chloride 0.9 % 250 mL IVPB        1,250 mg 166.7 mL/hr over 90 Minutes Intravenous Every 48 hours 08/19/12 0459     08/19/12 0800   piperacillin-tazobactam (ZOSYN) IVPB 3.375 g  Status:  Discontinued        3.375 g 100 mL/hr over 30 Minutes Intravenous Every 8 hours 08/19/12 0434 08/19/12 0501   08/19/12 0800  piperacillin-tazobactam (ZOSYN) IVPB 3.375 g       3.375 g 12.5 mL/hr over 240 Minutes Intravenous Every 8 hours 08/19/12 0501     08/19/12 0500   vancomycin (VANCOCIN) IVPB 1000 mg/200 mL premix        1,000 mg 200 mL/hr over 60 Minutes Intravenous  Once 08/19/12 0459 08/19/12 0621   08/19/12 0000   vancomycin (VANCOCIN) 1,500 mg in sodium chloride 0.9 % 500 mL IVPB        1,500 mg 250 mL/hr over 120 Minutes Intravenous  Once 08/18/12 2345 08/19/12 0248   08/19/12 0000   piperacillin-tazobactam (ZOSYN) IVPB 3.375 g       3.375 g 100 mL/hr over 30 Minutes Intravenous  Once 08/18/12 2345 08/19/12 0335          Assessment: 66 yo female admitted with AMS, weakness, fever of 102.9, lactic acid 2.8, hypotension ( improved w/ fluids) diagnosed with sepsis secondary to cellulitis. Patient is on zosyn and pharmacy consulted to manage vancomycin. WBC (trend down) 18>15.3   Goal of Therapy:  Vancomycin trough level 15-20 mcg/ml  Plan:  1.. Increase Vancomycin to 1500 mg IV Q48H based on obesity nomogram 2.  Monitor renal function and clinical progression 3.  Vanc trough at steady state  Bola A. Wandra Feinstein D Clinical Pharmacist Pager:[phone] Phone [phone] 08/19/2012 11:49 AM

## 2012-08-19 NOTE — ED Provider Notes (Addendum)
I saw and evaluated the patient, reviewed the resident's note and I agree with the findings and plan. Agree with EKG interpretation if present.   Pt with fall, found on floor of bathroom, brought to ED via EMS. Found to be febrile, tachycardic. Sepsis of unknown source. She is awake and alert. CXR and UA unremarkable. Given IVF bolus x 2000cc initially with minimal improvement. Abx ordered. Plan admit for treatment.   CRITICAL CARE Performed by: Pollyann Savoy   Total critical care time: 45  Critical care time was exclusive of separately billable procedures and treating other patients.  Critical care was necessary to treat or prevent imminent or life-threatening deterioration.  Critical care was time spent personally by me on the following activities: development of treatment plan with patient and/or surrogate as well as nursing, discussions with consultants, evaluation of patient's response to treatment, examination of patient, obtaining history from patient or surrogate, ordering and performing treatments and interventions, ordering and review of laboratory studies, ordering and review of radiographic studies, pulse oximetry and re-evaluation of patient's condition.   Leafy Motsinger B. Bernette Mayers, MD 08/19/12 [phone]

## 2012-08-19 NOTE — ED Notes (Signed)
Family at bedside. 

## 2012-08-19 NOTE — Progress Notes (Signed)
ANTIBIOTIC CONSULT NOTE - INITIAL  Pharmacy Consult for vancomycin Indication: Empiric  No Known Allergies  Patient Measurements: Height: 5\' 2"  (157.5 cm) Weight: 240 lb 8 oz (109.09 kg) IBW/kg (Calculated) : 50.1   Vital Signs: Temp: 102.6 F (39.2 C) (10/20 0405) Temp src: Oral (10/20 0405) BP: 126/97 mmHg (10/20 0405) Pulse Rate: 108  (10/20 0036) Intake/Output from previous day:   Intake/Output from this shift:    Labs:  [facility] 08/18/12 2222  WBC 18.2*  HGB 11.6*  PLT 191  LABCREA --  CREATININE 2.41*   Estimated Creatinine Clearance: 27.4 ml/min (by C-G formula based on Cr of 2.41). No results found for this basename: VANCOTROUGH:2,VANCOPEAK:2,VANCORANDOM:2,GENTTROUGH:2,GENTPEAK:2,GENTRANDOM:2,TOBRATROUGH:2,TOBRAPEAK:2,TOBRARND:2,AMIKACINPEAK:2,AMIKACINTROU:2,AMIKACIN:2, in the last 72 hours   Microbiology: No results found for this or any previous visit (from the past 720 hour(s)).  Medical History: Past Medical History  Diagnosis Date  . Diabetes mellitus without complication   . Hypertension   . IBS (irritable bowel syndrome)   . OSA (obstructive sleep apnea)   . Mood disorder     Medications:  Scheduled:    . docusate sodium  100 mg Oral BID  . enoxaparin (LOVENOX) injection  40 mg Subcutaneous Q24H  . folic acid-pyridoxine-cyancobalamin  1 tablet Oral Daily  . gabapentin  600 mg Oral BID  . glimepiride  4 mg Oral BID WC  . insulin aspart  0-20 Units Subcutaneous TID WC  . insulin aspart  0-5 Units Subcutaneous QHS  . insulin aspart protamine-insulin aspart  60 Units Subcutaneous Q breakfast  . insulin detemir  60 Units Subcutaneous QHS  . pantoprazole  80 mg Oral Q1200  . PARoxetine  40 mg Oral Daily  . piperacillin-tazobactam  3.375 g Intravenous Once  . piperacillin-tazobactam  3.375 g Intravenous Q8H  . sodium chloride  1,000 mL Intravenous Once  . sodium chloride  1,000 mL Intravenous Once  . sodium chloride  3 mL Intravenous Q12H  .  vancomycin  1,500 mg Intravenous Once  . Vitamin D (Ergocalciferol)  50,000 Units Oral Q7 days  . DISCONTD: Folic Acid-Vit B6-Vit B12  1 tablet Oral Daily   Assessment: 65 yo female admitted with r/o sepsis. Pharmacy consulted to manage vancomycin. Patient is also to receive Zosyn. Patient has already received 1gm IV vancomycin.   Goal of Therapy:  Vancomycin trough 10-20 mcg/mL  Plan:  1. Additional 1gm IV vancomycin (total 2gm loading dose) 2. Vancomycin 1.25gm IV Q48H.   Emeline Gins 08/19/2012,4:43 AM

## 2012-08-19 NOTE — ED Notes (Signed)
Pt temperature before transfer to floor 102.6. Dr. Conley Rolls Notified, RN on 3W notified.

## 2012-08-20 ENCOUNTER — Inpatient Hospital Stay (HOSPITAL_COMMUNITY): Payer: Medicare Other

## 2012-08-20 DIAGNOSIS — I10 Essential (primary) hypertension: Secondary | ICD-10-CM

## 2012-08-20 LAB — BASIC METABOLIC PANEL
BUN: 38 mg/dL — ABNORMAL HIGH (ref 6–23)
Chloride: 104 mEq/L (ref 96–112)
GFR calc Af Amer: 27 mL/min — ABNORMAL LOW (ref >90)
GFR calc non Af Amer: 23 mL/min — ABNORMAL LOW (ref >90)
Potassium: 3.6 mEq/L (ref 3.5–5.1)
Sodium: 136 mEq/L (ref 135–145)

## 2012-08-20 LAB — URINE CULTURE: Culture: NO GROWTH

## 2012-08-20 LAB — GLUCOSE, CAPILLARY
Glucose-Capillary: 140 mg/dL — ABNORMAL HIGH (ref 70–99)
Glucose-Capillary: 156 mg/dL — ABNORMAL HIGH (ref 70–99)

## 2012-08-20 MED ORDER — SODIUM CHLORIDE 0.9 % IV BOLUS (SEPSIS): 500.0000 mL | Freq: Once | INTRAVENOUS | Status: DC

## 2012-08-20 MED ORDER — METOPROLOL TARTRATE 12.5 MG HALF TABLET
12.5000 mg | ORAL_TABLET | Freq: Two times a day (BID) | ORAL | Status: DC
  Administered 2012-08-21 – 2012-08-24 (×7): 12.5 mg via ORAL
  Filled 2012-08-20 (×9): qty 1

## 2012-08-20 MED ORDER — SODIUM CHLORIDE 0.9 % IV BOLUS (SEPSIS)
500.0000 mL | Freq: Once | INTRAVENOUS | Status: AC
  Administered 2012-08-20: 500 mL via INTRAVENOUS

## 2012-08-20 MED ORDER — INSULIN DETEMIR 100 UNIT/ML ~~LOC~~ SOLN
70.0000 [IU] | Freq: Every day | SUBCUTANEOUS | Status: DC
Start: 2012-08-20 — End: 2012-08-22
  Administered 2012-08-20: 70 [IU] via SUBCUTANEOUS
  Filled 2012-08-20 (×2): qty 10

## 2012-08-20 MED ORDER — ZOLPIDEM TARTRATE 5 MG PO TABS
5.0000 mg | ORAL_TABLET | Freq: Once | ORAL | Status: AC
  Administered 2012-08-20: 5 mg via ORAL
  Filled 2012-08-20: qty 1

## 2012-08-20 NOTE — Progress Notes (Signed)
[facility] PROGRESS NOTE  Assessment/Plan: Cellulitis/fulliculitis of upper extremity: -Continue Vancomycin Zosyn, blood cultures pending.  She defervesced. -Wound care consult.  -Wound Culture.  Sepsis (08/19/2012) -Blood pressure continues to be stable. -Continue IV fluids, creatinine trending down. -Recheck lactic acid still high, bolus NS -continue to monitor Bp.  Acute lung injury:  -Continue IV fluids.good urine output -basic met. Pending.  Toxic encephalopathy : -Now resolved most likely secondary to cellulitis/folliculitis of the left upper extremity and sepsis.   Frequent falls (08/19/2012): -likely secondary to decreased Intravascular Volume. -Pt consult.  DM (diabetes mellitus) (08/19/2012): -blood glucose continues to be high. -increase SSI, lantus.  HTN (hypertension) (08/19/2012) -BP stable continue.    Code Status: FULL CODE. Family Communication: patient  Disposition Plan: TBD   Consultants:  none  Procedures:  none  Antibiotics:  Vancomycin Laqueta Jean 08/19/2012  HPI/Subjective: No complains.  Objective: Filed Vitals:   08/19/12 0455 08/19/12 1300 08/19/12 2100 08/20/12 0600  BP: 119/64 111/65 100/59 99/76  Pulse:   99 92  Temp: 100.9 F (38.3 C) 99.8 F (37.7 C) 99.4 F (37.4 C) 98.6 F (37 C)  TempSrc: Oral     Resp: 22 24 20 20   Height:      Weight:      SpO2: 97% 100% 100% 94%    Intake/Output Summary (Last 24 hours) at 08/20/12 0855 Last data filed at 08/20/12 0600  Gross per 24 hour  Intake   1593 ml  Output   2000 ml  Net   -407 ml   Filed Weights   08/19/12 0405  Weight: 109.09 kg (240 lb 8 oz)    Exam:  General: Alert, awake, oriented x3, in no acute distress.  HEENT: No bruits, no goiter.  Heart: Regular rate and rhythm, without murmurs, rubs, gallops.  Lungs: Good air movement, air movement. Abdomen: Soft, nontender, nondistended, positive bowel sounds.  Neuro: Grossly intact, nonfocal.   Data  Reviewed: Basic Metabolic Panel:  Lab 08/19/12 1610 08/18/12 2222  NA -- 131*  K -- 4.4  CL -- 95*  CO2 -- 21  GLUCOSE -- 337*  BUN -- 32*  CREATININE 2.31* 2.41*  CALCIUM -- 9.9  MG -- --  PHOS -- --   Liver Function Tests:  Lab 08/18/12 2222  AST 41*  ALT 21  ALKPHOS 90  BILITOT 0.5  PROT 7.2  ALBUMIN 3.4*   No results found for this basename: LIPASE:5,AMYLASE:5 in the last 168 hours No results found for this basename: AMMONIA:5 in the last 168 hours CBC:  Lab 08/19/12 0638 08/18/12 2338 08/18/12 2222  WBC 15.3* -- 18.2*  NEUTROABS -- 17.0* --  HGB 9.8* -- 11.6*  HCT 29.6* -- 34.5*  MCV 89.4 -- 90.3  PLT 159 -- 191   Cardiac Enzymes:  Lab 08/19/12 1621 08/19/12 1023 08/19/12 0638 08/18/12 2223  CKTOTAL -- -- -- --  CKMB -- -- -- --  CKMBINDEX -- -- -- --  TROPONINI <0.30 <0.30 <0.30 <0.30   BNP (last 3 results) No results found for this basename: PROBNP:3 in the last 8760 hours CBG:  Lab 08/19/12 2036 08/19/12 1639 08/19/12 1143 08/19/12 0737 08/18/12 2206  GLUCAP 208* 170* 243* 371* 306*    Recent Results (from the past 240 hour(s))  CULTURE, BLOOD (ROUTINE X 2)     Status: Normal (Preliminary result)   Collection Time   08/18/12 11:30 PM      Component Value Range Status Comment   Specimen Description BLOOD LEFT ARM  Final    Special Requests BOTTLES DRAWN AEROBIC ONLY 10CC   Final    Culture  Setup Time 08/19/2012 17:10   Final    Culture     Final    Value:        BLOOD CULTURE RECEIVED NO GROWTH TO DATE CULTURE WILL BE HELD FOR 5 DAYS BEFORE ISSUING A FINAL NEGATIVE REPORT   Report Status PENDING   Incomplete   CULTURE, BLOOD (ROUTINE X 2)     Status: Normal (Preliminary result)   Collection Time   08/18/12 11:36 PM      Component Value Range Status Comment   Specimen Description BLOOD RIGHT ARM   Final    Special Requests BOTTLES DRAWN AEROBIC ONLY 10CC   Final    Culture  Setup Time 08/19/2012 17:10   Final    Culture     Final     Value:        BLOOD CULTURE RECEIVED NO GROWTH TO DATE CULTURE WILL BE HELD FOR 5 DAYS BEFORE ISSUING A FINAL NEGATIVE REPORT   Report Status PENDING   Incomplete   WOUND CULTURE     Status: Normal (Preliminary result)   Collection Time   08/19/12  2:14 PM      Component Value Range Status Comment   Specimen Description WOUND LEFT FOREARM   Final    Special Requests NONE   Final    Gram Stain PENDING   Incomplete    Culture NO GROWTH   Final    Report Status PENDING   Incomplete      Studies: Ct Head Wo Contrast  08/18/2012  *RADIOLOGY REPORT*  Clinical Data:  Multiple falls, altered mental status  CT HEAD WITHOUT CONTRAST CT CERVICAL SPINE WITHOUT CONTRAST  Technique:  Multidetector CT imaging of the head and cervical spine was performed following the standard protocol without intravenous contrast.  Multiplanar CT image reconstructions of the cervical spine were also generated.  Comparison:  MRI brain dated 09/15/2009  CT HEAD  Findings: Severely motion degraded images.  No evidence of parenchymal hemorrhage or extra-axial fluid collection. No mass lesion, mass effect, or midline shift.  No CT evidence of acute infarction. Subcortical white matter and periventricular small vessel ischemic changes.  Global cortical atrophy.  No ventriculomegaly.  The visualized paranasal sinuses are essentially clear. The mastoid air cells are unopacified.  No evidence of calvarial fracture.  IMPRESSION: Severely motion degraded images.  No evidence of acute intracranial abnormality.  Atrophy with small vessel ischemic changes and intracranial atherosclerosis.  CT CERVICAL SPINE  Findings: Motion degraded images.  Straightening of the cervical spine, possibly positional.  No evidence of fracture or dislocation.  The vertebral body heights are maintained.  Dens appears intact.  No prevertebral soft tissue swelling.  Mild multilevel degenerative changes.  Visualized right thyroid is enlarged/nodular.  Visualized lung  apices are essentially clear.  IMPRESSION: No evidence of traumatic injury to the cervical spine.  Mild multilevel degenerative changes.   Original Report Authenticated By: Charline Bills, M.D.    Ct Cervical Spine Wo Contrast  08/18/2012  *RADIOLOGY REPORT*  Clinical Data:  Multiple falls, altered mental status  CT HEAD WITHOUT CONTRAST CT CERVICAL SPINE WITHOUT CONTRAST  Technique:  Multidetector CT imaging of the head and cervical spine was performed following the standard protocol without intravenous contrast.  Multiplanar CT image reconstructions of the cervical spine were also generated.  Comparison:  MRI brain dated 09/15/2009  CT HEAD  Findings: Severely  motion degraded images.  No evidence of parenchymal hemorrhage or extra-axial fluid collection. No mass lesion, mass effect, or midline shift.  No CT evidence of acute infarction. Subcortical white matter and periventricular small vessel ischemic changes.  Global cortical atrophy.  No ventriculomegaly.  The visualized paranasal sinuses are essentially clear. The mastoid air cells are unopacified.  No evidence of calvarial fracture.  IMPRESSION: Severely motion degraded images.  No evidence of acute intracranial abnormality.  Atrophy with small vessel ischemic changes and intracranial atherosclerosis.  CT CERVICAL SPINE  Findings: Motion degraded images.  Straightening of the cervical spine, possibly positional.  No evidence of fracture or dislocation.  The vertebral body heights are maintained.  Dens appears intact.  No prevertebral soft tissue swelling.  Mild multilevel degenerative changes.  Visualized right thyroid is enlarged/nodular.  Visualized lung apices are essentially clear.  IMPRESSION: No evidence of traumatic injury to the cervical spine.  Mild multilevel degenerative changes.   Original Report Authenticated By: Charline Bills, M.D.    Dg Chest Port 1 View  08/18/2012  *RADIOLOGY REPORT*  Clinical Data: Altered mental status.  Fall.   PORTABLE CHEST - 1 VIEW  Comparison: 01/31/2009  Findings: Shallow inspiration.  Normal heart size and pulmonary vascularity for technique.  No focal airspace consolidation in the lungs.  No blunting of costophrenic angles.  No pneumothorax.  No significant changes since the previous study.  IMPRESSION: No evidence of active disease.   Original Report Authenticated By: Marlon Pel, M.D.    Dg Hand Complete Left  08/19/2012  *RADIOLOGY REPORT*  Clinical Data: Pain and swelling.  Skin tear.  Post chills in the hand and wrist.  LEFT HAND - COMPLETE 3+ VIEW  Comparison: None.  Findings: Degenerative changes in the interphalangeal, first metacarpal phalangeal, first carpometacarpal, and STT joints. Postoperative changes with plate screw fixation of the distal radial metaphysis and old ununited ulnar styloid process.  No acute fracture or subluxation.  No focal bone lesion or bone destruction. Diffuse soft tissue swelling over the carpal area.  No radiopaque soft tissue foreign bodies.  IMPRESSION: Degenerative changes in the hand and wrist.  Postoperative changes in the wrist.  Soft tissue swelling.  No acute bony abnormalities.   Original Report Authenticated By: Marlon Pel, M.D.     Scheduled Meds:    . docusate sodium  100 mg Oral BID  . enoxaparin (LOVENOX) injection  30 mg Subcutaneous Daily  . folic acid-pyridoxine-cyancobalamin  1 tablet Oral Daily  . gabapentin  600 mg Oral BID  . glimepiride  4 mg Oral BID WC  . insulin aspart  0-20 Units Subcutaneous TID WC  . insulin aspart  0-5 Units Subcutaneous QHS  . insulin detemir  60 Units Subcutaneous QHS  . pantoprazole  80 mg Oral Q1200  . PARoxetine  40 mg Oral Daily  . piperacillin-tazobactam  3.375 g Intravenous Q8H  . sodium chloride  3 mL Intravenous Q12H  . vancomycin  1,500 mg Intravenous Q48H  . Vitamin D (Ergocalciferol)  50,000 Units Oral Q7 days  . DISCONTD: insulin aspart protamine-insulin aspart  60 Units  Subcutaneous Q breakfast  . DISCONTD: vancomycin  1,250 mg Intravenous Q48H   Continuous Infusions:    . sodium chloride 100 mL/hr at 08/20/12 0600     FELIZ Rosine Beat  [facility] Pager [phone]. If 8PM-8AM, please contact night-coverage at [email or web address], password [facility] 08/20/2012, 8:55 AM  LOS: 2 days

## 2012-08-20 NOTE — Progress Notes (Signed)
Pt up to bedside commode x 2 and was unable to void. In and out catheterization performed with 500 cc of urine obtained. Labia swollen and tender. Pt complained of pain when meatus cleansed. States this are usual symptoms when she has a UTI.

## 2012-08-20 NOTE — Consult Note (Signed)
WOC consult Note Reason for Consult: Pt fell at home prior to admission and has multiple skin tears to arms. Wound type: Partial thickness skin abrasions.  Left arm with generalized edema and several blisters which have ruptured and evolved into partial thickness wounds. Drainage (amount, consistency, odor) mod yellow drainage, no odor. Periwound: red patchy areas of wounds, dark purple bruised areas. Dressing procedure/placement/frequency: Foam dressing to protect and absorb drainage. Will not plan to follow further unless re-consulted.  [address], RN, MSN, [employer]  [phone]

## 2012-08-20 NOTE — Evaluation (Signed)
Physical Therapy Evaluation Patient Details Name: Angie Wang MRN: [id number] DOB: 1945/11/06 Today's Date: 08/20/2012 Time: 1478-2956 PT Time Calculation (min): 38 min  PT Assessment / Plan / Recommendation Clinical Impression  pt presents with Cellulitis and SOB with history of COPD and CHF.  pt very SOB with minimal activity and at this time requiring A to get OOB.  Feel pt not safe for return home alone and would benefit from ST-SNF at D/C.      PT Assessment  Patient needs continued PT services    Follow Up Recommendations  Post acute inpatient    Does the patient have the potential to tolerate intense rehabilitation   No, Recommend SNF  Barriers to Discharge None      Equipment Recommendations  None recommended by PT    Recommendations for Other Services OT consult   Frequency Min 3X/week    Precautions / Restrictions Precautions Precautions: Fall Restrictions Weight Bearing Restrictions: No   Pertinent Vitals/Pain Indicates only pain is if she bumps her L arm.  Pt on 3L O2 throughout PT.        Mobility  Bed Mobility Bed Mobility: Supine to Sit;Sitting - Scoot to Edge of Bed Supine to Sit: 4: Min assist Sitting - Scoot to Edge of Bed: 3: Mod assist Details for Bed Mobility Assistance: A pt with bringing trunk up to sitting and used pad to bring hips to EOB.   Transfers Transfers: Sit to Stand;Stand to Sit;Stand Pivot Transfers Sit to Stand: 4: Min assist;With upper extremity assist;From bed;From chair/3-in-1;With armrests Stand to Sit: 4: Min guard;With upper extremity assist;To chair/3-in-1;With armrests Stand Pivot Transfers: 4: Min assist Details for Transfer Assistance: Performed SPT without RW and required MinA for steadying.  Cues for UE use and to get closer prior to sitting.   Ambulation/Gait Ambulation/Gait Assistance: 4: Min guard;4: Min Environmental consultant (Feet): 20 Feet Assistive device: Rolling walker Ambulation/Gait Assistance  Details: pt mostly MinG for ambulation, but required MinA to negotiate RW around furniture.  pt very SOB with increased RR during ambulation, but O2 sats 100% after ambulating to chair.   Gait Pattern: Step-through pattern;Decreased stride length;Shuffle;Trunk flexed Stairs: No Wheelchair Mobility Wheelchair Mobility: No    Shoulder Instructions     Exercises     PT Diagnosis: Difficulty walking (Decondition/Debility)  PT Problem List: Decreased activity tolerance;Decreased balance;Decreased mobility;Decreased knowledge of use of DME;Cardiopulmonary status limiting activity;Obesity PT Treatment Interventions: DME instruction;Gait training;Functional mobility training;Therapeutic activities;Therapeutic exercise;Balance training;Patient/family education   PT Goals Acute Rehab PT Goals PT Goal Formulation: With patient Time For Goal Achievement: 09/03/12 Potential to Achieve Goals: Good Pt will go Supine/Side to Sit: with modified independence PT Goal: Supine/Side to Sit - Progress: Goal set today Pt will go Sit to Supine/Side: with modified independence PT Goal: Sit to Supine/Side - Progress: Goal set today Pt will go Sit to Stand: with modified independence PT Goal: Sit to Stand - Progress: Goal set today Pt will go Stand to Sit: with modified independence PT Goal: Stand to Sit - Progress: Goal set today Pt will Ambulate: >150 feet;with modified independence;with rolling walker PT Goal: Ambulate - Progress: Goal set today  Visit Information  Last PT Received On: 08/20/12 Assistance Needed: +1    Subjective Data  Subjective: I just do what I can.   Patient Stated Goal: Home with dog   Prior Functioning  Home Living Lives With: Alone Available Help at Discharge:  (Unclear if its family or a friend that can check on  pt.  ) Type of Home: Apartment Home Access: Ramped entrance Home Layout: One level Bathroom Shower/Tub: Engineer, manufacturing systems: Handicapped  height Bathroom Accessibility: Yes How Accessible: Accessible via walker Home Adaptive Equipment: Grab bars in shower;Shower chair with back;Straight cane;Walker - rolling (Lift chair) Prior Function Level of Independence: Independent with assistive device(s) Able to Take Stairs?: No Driving: Yes Vocation: On disability Communication Communication: No difficulties    Cognition  Overall Cognitive Status: Appears within functional limits for tasks assessed/performed Arousal/Alertness: Awake/alert Orientation Level: Appears intact for tasks assessed Behavior During Session: [facility] for tasks performed    Extremity/Trunk Assessment Right Lower Extremity Assessment RLE ROM/Strength/Tone: WFL for tasks assessed RLE Sensation: WFL - Light Touch Left Lower Extremity Assessment LLE ROM/Strength/Tone: WFL for tasks assessed LLE Sensation: WFL - Light Touch Trunk Assessment Trunk Assessment: Kyphotic   Balance Balance Balance Assessed: No  End of Session PT - End of Session Equipment Utilized During Treatment: Gait belt;Oxygen Activity Tolerance: Patient limited by fatigue Patient left: in chair;with call bell/phone within reach Nurse Communication: Mobility status  GP     Sunny Schlein, Brantley [phone] 08/20/2012, 3:06 PM

## 2012-08-20 NOTE — Progress Notes (Signed)
Inpatient Diabetes Program Recommendations  AACE/ADA: New Consensus Statement on Inpatient Glycemic Control (2013)  Target Ranges:  Prepandial:   less than 140 mg/dL      Peak postprandial:   less than 180 mg/dL (1-2 hours)      Critically ill patients:  140 - 180 mg/dL   Reason for Visit: Hyperglycemia  Angie Wang is an 66 y.o. female with hx of DM, HTN, IBS, OSA living at home alone with help from nephew and brother, brought to the ER as she was found unconscious in the bathroom. HOME MEDS:  Prior to Admission medications   Medication  Sig  Start Date  End Date  Taking?  Authorizing Provider   amitriptyline (ELAVIL) 25 MG tablet  Take 25 mg by mouth at bedtime.    Yes  Historical Provider, MD   celecoxib (CELEBREX) 200 MG capsule  Take 200 mg by mouth 2 (two) times daily.    Yes  Historical Provider, MD   clonazePAM (KLONOPIN) 1 MG tablet  Take 1 mg by mouth 2 (two) times daily as needed.    Yes  Historical Provider, MD   cyclobenzaprine (FLEXERIL) 10 MG tablet  Take 10 mg by mouth 2 (two) times daily.    Yes  Historical Provider, MD   esomeprazole (NEXIUM) 40 MG capsule  Take 40 mg by mouth daily before breakfast.    Yes  Historical Provider, MD   Folic Acid-Vit B6-Vit B12 (FOLBEE) 2.5-25-1 MG TABS  Take 1 tablet by mouth daily.    Yes  Historical Provider, MD   furosemide (LASIX) 80 MG tablet  Take 80 mg by mouth daily. Bottle says take "one-half tablet By mouth daily"    Yes  Historical Provider, MD   gabapentin (NEURONTIN) 600 MG tablet  Take 600 mg by mouth 2 (two) times daily.    Yes  Historical Provider, MD   glimepiride (AMARYL) 4 MG tablet  Take 4 mg by mouth 2 (two) times daily.    Yes  Historical Provider, MD   hydrOXYzine (ATARAX/VISTARIL) 25 MG tablet  Take 25 mg by mouth 4 (four) times daily as needed.    Yes  Historical Provider, MD   insulin aspart (NOVOLOG) 100 UNIT/ML injection  Inject 60 Units into the skin at bedtime as needed.    Yes  Historical Provider, MD     insulin aspart protamine-insulin aspart (NOVOLOG 70/30) (70-30) 100 UNIT/ML injection  Inject 60 Units into the skin every morning.    Yes  Historical Provider, MD   insulin detemir (LEVEMIR) 100 UNIT/ML injection  Inject 60 Units into the skin at bedtime.    Yes  Historical Provider, MD   lisinopril-hydrochlorothiazide (PRINZIDE,ZESTORETIC) 20-25 MG per tablet  Take 1 tablet by mouth daily.    Yes  Historical Provider, MD   metFORMIN (GLUCOPHAGE) 1000 MG tablet  Take 1,000 mg by mouth 2 (two) times daily with a meal.    Yes  Historical Provider, MD   PARoxetine (PAXIL) 40 MG tablet  Take 40 mg by mouth every morning.    Yes  Historical Provider, MD   promethazine (PHENERGAN) 25 MG tablet  Take 25 mg by mouth every 6 (six) hours as needed.    Yes  Historical Provider, MD   traZODone (DESYREL) 100 MG tablet  Take 200 mg by mouth at bedtime.    Yes  Historical Provider, MD   Vitamin D, Ergocalciferol, (DRISDOL) 50000 UNITS CAPS  Take 50,000 Units by mouth every 7 (seven)  days. On Sunday    Yes  Historical Provider, MD    Recommendations:  Check HgbA1C to assess glycemic control prior to hospitalization.  Will follow.

## 2012-08-21 DIAGNOSIS — R4182 Altered mental status, unspecified: Secondary | ICD-10-CM

## 2012-08-21 LAB — CBC
Hemoglobin: 8.2 g/dL — ABNORMAL LOW (ref 12.0–15.0)
MCH: 29.7 pg (ref 26.0–34.0)
Platelets: 146 10*3/uL — ABNORMAL LOW (ref 150–400)
RBC: 2.76 MIL/uL — ABNORMAL LOW (ref 3.87–5.11)
WBC: 8.9 10*3/uL (ref 4.0–10.5)

## 2012-08-21 LAB — BASIC METABOLIC PANEL
CO2: 20 mEq/L (ref 19–32)
Chloride: 108 mEq/L (ref 96–112)
Glucose, Bld: 63 mg/dL — ABNORMAL LOW (ref 70–99)
Potassium: 3.3 mEq/L — ABNORMAL LOW (ref 3.5–5.1)
Sodium: 140 mEq/L (ref 135–145)

## 2012-08-21 LAB — WOUND CULTURE

## 2012-08-21 LAB — GLUCOSE, CAPILLARY
Glucose-Capillary: 44 mg/dL — CL (ref 70–99)
Glucose-Capillary: 89 mg/dL (ref 70–99)
Glucose-Capillary: 144 mg/dL — ABNORMAL HIGH (ref 70–99)
Glucose-Capillary: 105 mg/dL — ABNORMAL HIGH (ref 70–99)

## 2012-08-21 MED ORDER — ZOLPIDEM TARTRATE 5 MG PO TABS
5.0000 mg | ORAL_TABLET | Freq: Once | ORAL | Status: AC
  Administered 2012-08-21: 5 mg via ORAL
  Filled 2012-08-21: qty 1

## 2012-08-21 MED ORDER — ENOXAPARIN SODIUM 40 MG/0.4ML ~~LOC~~ SOLN
40.0000 mg | SUBCUTANEOUS | Status: DC
  Administered 2012-08-21 – 2012-08-24 (×4): 40 mg via SUBCUTANEOUS
  Filled 2012-08-21 (×4): qty 0.4

## 2012-08-21 MED ORDER — POTASSIUM CHLORIDE CRYS ER 20 MEQ PO TBCR
40.0000 meq | EXTENDED_RELEASE_TABLET | Freq: Once | ORAL | Status: AC
  Administered 2012-08-21: 40 meq via ORAL

## 2012-08-21 NOTE — Progress Notes (Signed)
Clarified with MD to hold tonight's dose of levemir. Will cont to monitor pt.

## 2012-08-21 NOTE — Care Management Note (Signed)
  Page 1 of 1   08/22/2012     11:05:09 AM   CARE MANAGEMENT NOTE 08/22/2012  Patient:  Angie Wang, Angie Wang   Account Number:  [id number]  Date Initiated:  08/21/2012  Documentation initiated by:  GRAVES-BIGELOW,Majed Pellegrin  Subjective/Objective Assessment:   Pt admitted with Fall, AMS,  Loss of Consciousness. Pt is from home- has life alert.     Action/Plan:   CM will conitnue to monitor for disposition needs.   Anticipated DC Date:  08/23/2012   Anticipated DC Plan:  HOME W HOME HEALTH SERVICES      DC Planning Services  CM consult      Choice offered to / List presented to:             Status of service:  In process, will continue to follow Medicare Important Message given?   (If response is "NO", the following Medicare IM given date fields will be blank) Date Medicare IM given:   Date Additional Medicare IM given:    Discharge Disposition:    Per UR Regulation:  Reviewed for med. necessity/level of care/duration of stay  If discussed at Long Length of Stay Meetings, dates discussed:    Comments:  08-22-12 [address] [place], [place] [phone] CM did speak to pt this am and she was a little confused thinking she was home in her apartment. She stated it's just me and Lcuy here (Her dog). CM tried to reorient pt to place and time. CM asked if pt had family to help her at d/c and she lives alone with family out of town. CM did get a number for the sister Lupita Leash [phone]). Pt would rather go home, however she did give permission for SNF search. Pt would only like short term placement. CSW is aware of plan. CM will call sister Lupita Leash for help with disposition.

## 2012-08-21 NOTE — Progress Notes (Signed)
[facility] PROGRESS NOTE  Assessment/Plan: Cellulitis/fulliculitis of upper extremity: -08/18/2012 Vancomycin Zosyn, zosyn d/c on 08/21/2012.  -She defervesced. -Wound care consulted  -Wound Culture, blood cultures 10/19 negative till date.  Sepsis (08/19/2012) -Resolved. Most likely 2/2 cellulitis. -Bp medication held on admission. -continue to monitor Bp. -Urine culture continue to be negative d/c zosyn 08/21/2012. -CXR 08/20/2012 no infiltrate or edema.  Acute lung injury:  -KVO IV fluids.good urine output -Cr trending down, unknown baseline. -strict I and O's. -voiding trial d/c foley  Toxic encephalopathy : -Now resolved most likely secondary to cellulitis/folliculitis of the left upper extremity and sepsis.   Frequent falls (08/19/2012): -likely secondary to decreased Intravascular Volume. -Pt consult, no SNF.  DM (diabetes mellitus) (08/19/2012): -blood glucose improved controlled -continue SSI, lantus.  HTN (hypertension) (08/19/2012) -BP stable continue.  -continue to hold Bp meds.  Code Status: FULL CODE. Family Communication: patient  Disposition Plan: D/c home in 24-48hrs   Consultants:  none  Procedures:  none  Antibiotics:  Vancomycin Laqueta Jean 08/18/2012, zosyn d/c 08/21/2012  HPI/Subjective: No complains.  Objective: Filed Vitals:   08/20/12 1215 08/20/12 1400 08/20/12 1723 08/21/12 0458  BP: 114/70 98/54  102/59  Pulse:  96  85  Temp:  99.2 F (37.3 C)  98 F (36.7 C)  TempSrc:    Oral  Resp:  20  20  Height:      Weight:    118.298 kg (260 lb 12.8 oz)  SpO2:  97% 99% 100%    Intake/Output Summary (Last 24 hours) at 08/21/12 0831 Last data filed at 08/21/12 0525  Gross per 24 hour  Intake    240 ml  Output    551 ml  Net   -311 ml   Filed Weights   08/19/12 0405 08/21/12 0458  Weight: 109.09 kg (240 lb 8 oz) 118.298 kg (260 lb 12.8 oz)    Exam:  General: Alert, awake, oriented x3, in no acute distress.    HEENT: No bruits, no goiter.  Heart: Regular rate and rhythm, without murmurs, rubs, gallops.  Lungs: Good air movement, air movement. Abdomen: Soft, nontender, nondistended, positive bowel sounds.  Neuro: Grossly intact, nonfocal.   Data Reviewed: Basic Metabolic Panel:  Lab 08/21/12 4696 08/20/12 1031 08/19/12 0638 08/18/12 2222  NA 140 136 -- 131*  K 3.3* 3.6 -- 4.4  CL 108 104 -- 95*  CO2 20 22 -- 21  GLUCOSE 63* 243* -- 337*  BUN 36* 38* -- 32*  CREATININE 1.88* 2.16* 2.31* 2.41*  CALCIUM 8.0* 7.7* -- 9.9  MG -- -- -- --  PHOS -- -- -- --   Liver Function Tests:  Lab 08/18/12 2222  AST 41*  ALT 21  ALKPHOS 90  BILITOT 0.5  PROT 7.2  ALBUMIN 3.4*   No results found for this basename: LIPASE:5,AMYLASE:5 in the last 168 hours No results found for this basename: AMMONIA:5 in the last 168 hours CBC:  Lab 08/21/12 0618 08/19/12 0638 08/18/12 2338 08/18/12 2222  WBC 8.9 15.3* -- 18.2*  NEUTROABS -- -- 17.0* --  HGB 8.2* 9.8* -- 11.6*  HCT 24.8* 29.6* -- 34.5*  MCV 89.9 89.4 -- 90.3  PLT 146* 159 -- 191   Cardiac Enzymes:  Lab 08/19/12 1621 08/19/12 1023 08/19/12 0638 08/18/12 2223  CKTOTAL -- -- -- --  CKMB -- -- -- --  CKMBINDEX -- -- -- --  TROPONINI <0.30 <0.30 <0.30 <0.30   BNP (last 3 results) No results found for this basename: PROBNP:3  in the last 8760 hours CBG:  Lab 08/20/12 2100 08/20/12 1642 08/20/12 1203 08/20/12 0747 08/19/12 2036  GLUCAP 156* 140* 188* 182* 208*    Recent Results (from the past 240 hour(s))  URINE CULTURE     Status: Normal   Collection Time   08/18/12 10:26 PM      Component Value Range Status Comment   Specimen Description URINE, RANDOM   Final    Special Requests [id number] ON [id number] @2348    Final    Culture  Setup Time 08/19/2012 17:13   Final    Colony Count NO GROWTH   Final    Culture NO GROWTH   Final    Report Status 08/20/2012 FINAL   Final   CULTURE, BLOOD (ROUTINE X 2)     Status: Normal (Preliminary  result)   Collection Time   08/18/12 11:30 PM      Component Value Range Status Comment   Specimen Description BLOOD LEFT ARM   Final    Special Requests BOTTLES DRAWN AEROBIC ONLY 10CC   Final    Culture  Setup Time 08/19/2012 17:10   Final    Culture     Final    Value:        BLOOD CULTURE RECEIVED NO GROWTH TO DATE CULTURE WILL BE HELD FOR 5 DAYS BEFORE ISSUING A FINAL NEGATIVE REPORT   Report Status PENDING   Incomplete   CULTURE, BLOOD (ROUTINE X 2)     Status: Normal (Preliminary result)   Collection Time   08/18/12 11:36 PM      Component Value Range Status Comment   Specimen Description BLOOD RIGHT ARM   Final    Special Requests BOTTLES DRAWN AEROBIC ONLY 10CC   Final    Culture  Setup Time 08/19/2012 17:10   Final    Culture     Final    Value:        BLOOD CULTURE RECEIVED NO GROWTH TO DATE CULTURE WILL BE HELD FOR 5 DAYS BEFORE ISSUING A FINAL NEGATIVE REPORT   Report Status PENDING   Incomplete   WOUND CULTURE     Status: Normal (Preliminary result)   Collection Time   08/19/12  2:14 PM      Component Value Range Status Comment   Specimen Description WOUND LEFT FOREARM   Final    Special Requests NONE   Final    Gram Stain     Final    Value: FEW WBC PRESENT, PREDOMINANTLY PMN     NO SQUAMOUS EPITHELIAL CELLS SEEN     NO ORGANISMS SEEN   Culture NO GROWTH   Final    Report Status PENDING   Incomplete      Studies: Dg Chest Port 1 View  08/20/2012  *RADIOLOGY REPORT*  Clinical Data: Short of breath, weakness, pulmonary edema  PORTABLE CHEST - 1 VIEW  Comparison: 08/18/2012  Findings: Low lung volumes.  Lungs are essentially clear.  No pleural effusion or pneumothorax.  The heart is top normal in size.  IMPRESSION: No evidence of acute cardiopulmonary disease.  Low lung volumes.   Original Report Authenticated By: Charline Bills, M.D.     Scheduled Meds:    . docusate sodium  100 mg Oral BID  . enoxaparin (LOVENOX) injection  30 mg Subcutaneous Daily  .  folic acid-pyridoxine-cyancobalamin  1 tablet Oral Daily  . gabapentin  600 mg Oral BID  . glimepiride  4 mg Oral BID WC  . insulin aspart  0-20 Units Subcutaneous TID WC  . insulin aspart  0-5 Units Subcutaneous QHS  . insulin detemir  70 Units Subcutaneous QHS  . metoprolol tartrate  12.5 mg Oral BID  . pantoprazole  80 mg Oral Q1200  . PARoxetine  40 mg Oral Daily  . piperacillin-tazobactam  3.375 g Intravenous Q8H  . potassium chloride  40 mEq Oral Once  . sodium chloride  500 mL Intravenous Once  . sodium chloride  3 mL Intravenous Q12H  . vancomycin  1,500 mg Intravenous Q48H  . Vitamin D (Ergocalciferol)  50,000 Units Oral Q7 days  . zolpidem  5 mg Oral Once  . DISCONTD: insulin detemir  60 Units Subcutaneous QHS  . DISCONTD: sodium chloride  500 mL Intravenous Once  . DISCONTD: sodium chloride  500 mL Intravenous Once   Continuous Infusions:    . DISCONTD: sodium chloride Stopped (08/20/12 1246)     Marinda Elk  [facility] Pager [phone]. If 8PM-8AM, please contact night-coverage at [email or web address], password [facility] 08/21/2012, 8:31 AM  LOS: 3 days

## 2012-08-22 LAB — GLUCOSE, CAPILLARY
Glucose-Capillary: 66 mg/dL — ABNORMAL LOW (ref 70–99)
Glucose-Capillary: 98 mg/dL (ref 70–99)
Glucose-Capillary: 177 mg/dL — ABNORMAL HIGH (ref 70–99)
Glucose-Capillary: 235 mg/dL — ABNORMAL HIGH (ref 70–99)

## 2012-08-22 LAB — BASIC METABOLIC PANEL
BUN: 26 mg/dL — ABNORMAL HIGH (ref 6–23)
CO2: 22 mEq/L (ref 19–32)
Calcium: 8.5 mg/dL (ref 8.4–10.5)
Chloride: 107 mEq/L (ref 96–112)
Creatinine, Ser: 1.45 mg/dL — ABNORMAL HIGH (ref 0.50–1.10)
Glucose, Bld: 74 mg/dL (ref 70–99)

## 2012-08-22 MED ORDER — INSULIN DETEMIR 100 UNIT/ML ~~LOC~~ SOLN
60.0000 [IU] | Freq: Every day | SUBCUTANEOUS | Status: DC
  Administered 2012-08-22 – 2012-08-23 (×2): 60 [IU] via SUBCUTANEOUS
  Filled 2012-08-22: qty 10

## 2012-08-22 MED ORDER — PIPERACILLIN-TAZOBACTAM 3.375 G IVPB
3.3750 g | Freq: Three times a day (TID) | INTRAVENOUS | Status: DC
  Administered 2012-08-22 – 2012-08-23 (×3): 3.375 g via INTRAVENOUS
  Filled 2012-08-22 (×6): qty 50

## 2012-08-22 MED ORDER — VANCOMYCIN HCL 1000 MG IV SOLR
1500.0000 mg | INTRAVENOUS | Status: DC
  Administered 2012-08-22: 1500 mg via INTRAVENOUS
  Filled 2012-08-22: qty 1500

## 2012-08-22 NOTE — Clinical Social Work Psychosocial (Signed)
     Clinical Social Work Department BRIEF PSYCHOSOCIAL ASSESSMENT 08/22/2012  Patient:  Angie Wang, Angie Wang     Account Number:  [id number]     Admit date:  08/18/2012  Clinical Social Worker:  Margaree Mackintosh  Date/Time:  08/22/2012 12:00 M  Referred by:  Care Management  Date Referred:  08/22/2012 Referred for  SNF Placement   Other Referral:   Interview type:  Family Other interview type:   Pt currently not fully oriented.    PSYCHOSOCIAL DATA Living Status:  ALONE Admitted from facility:   Level of care:   Primary support name:  Angie Wang: [phone] Primary support relationship to patient:  SIBLING Degree of support available:   Adequate.    CURRENT CONCERNS Current Concerns  Post-Acute Placement   Other Concerns:    SOCIAL WORK ASSESSMENT / PLAN Clinical Social Worker recieved referral from [facility] indicating expressing concerns that pt is not oriented to place or time and as such questions pt's safety at home. CSW reviewed chart and phoned number in chart for pt's sister, Angie Wang.  Angie Wang's son, Angie Wang, answered the phone.  Per Angie Wang, pt's sister lives close by but sometimes forgets to charge her cellphone.  Angie Wang will ask pt's sister to phone CSW.  CSW introduced self and explained role.  Angie Wang open to SNF search as he states no one is able to provide 24 hour care to pt.  Angie Wang requested [employer] counties. CSW to begin SNF search and continue to follow and assist as needed.   Assessment/plan status:  Information/Referral to [facility] Other assessment/ plan:   Information/referral to community resources:   SNF>    PATIENTS/FAMILYS RESPONSE TO PLAN OF CARE: Pt currently unable to participate in assessment due to not being fully oriented.  Angie Wang was open to CSW intervention.

## 2012-08-22 NOTE — Progress Notes (Signed)
Physical Therapy Treatment Patient Details Name: Angie Wang MRN: [id number] DOB: 1946-07-09 Today's Date: 08/22/2012 Time: 4034-7425 PT Time Calculation (min): 23 min  PT Assessment / Plan / Recommendation Comments on Treatment Session  Pt progressing with PT goals at this date.  Spoke with RN before session & RN states pt on RA at this time & to that it is ok to trial session without supplemental 02.  Pt with mild SOB while ambulating.      Follow Up Recommendations  Post acute inpatient     Does the patient have the potential to tolerate intense rehabilitation  No, Recommend SNF  Barriers to Discharge        Equipment Recommendations  None recommended by PT    Recommendations for Other Services OT consult  Frequency Min 3X/week   Plan Discharge plan remains appropriate    Precautions / Restrictions Precautions Precautions: Fall Restrictions Weight Bearing Restrictions: No   Pertinent Vitals/Pain No pain reported.  Mild SOB noted with ambulation    Mobility  Bed Mobility Bed Mobility: Not assessed Details for Bed Mobility Assistance: pt sitting on EOB upon arrival  Transfers Transfers: Sit to Stand;Stand to Sit Sit to Stand: 4: Min guard;With upper extremity assist;From bed;From toilet Stand to Sit: 4: Min guard;With upper extremity assist;To bed;To toilet Details for Transfer Assistance: Cues for safest hand placement Ambulation/Gait Ambulation/Gait Assistance: 5: Supervision Ambulation Distance (Feet): 80 Feet Assistive device: Rolling walker Ambulation/Gait Assistance Details: Cues for tall posture & safe negotiation of RW (pt tends to cut close to furniture.  Mild SOB noted requiring cues for pursed lip breathing.   Gait Pattern: Step-through pattern;Decreased stride length (decreased step height) Stairs: No Wheelchair Mobility Wheelchair Mobility: No       PT Goals Acute Rehab PT Goals Time For Goal Achievement: 09/03/12 Potential to Achieve Goals:  Good Pt will go Supine/Side to Sit: with modified independence Pt will go Sit to Supine/Side: with modified independence Pt will go Sit to Stand: with modified independence PT Goal: Sit to Stand - Progress: Progressing toward goal Pt will go Stand to Sit: with modified independence PT Goal: Stand to Sit - Progress: Progressing toward goal Pt will Ambulate: >150 feet;with modified independence;with rolling walker PT Goal: Ambulate - Progress: Progressing toward goal  Visit Information  Last PT Received On: 08/22/12 Assistance Needed: +1    Subjective Data  Patient Stated Goal: Home with dog   Cognition  Overall Cognitive Status: Appears within functional limits for tasks assessed/performed Arousal/Alertness: Awake/alert Orientation Level: Appears intact for tasks assessed Behavior During Session: [facility] for tasks performed    Balance  Balance Balance Assessed: Yes Static Standing Balance Static Standing - Balance Support: During functional activity;No upper extremity supported Static Standing - Level of Assistance: 5: Stand by assistance Static Standing - Comment/# of Minutes: Pt able to stand ~ 1 minute with SBA on a couple of ocassions throughout session  End of Session PT - End of Session Equipment Utilized During Treatment: Gait belt Activity Tolerance: Patient tolerated treatment well Patient left: Other (comment);with call bell/phone within reach (sitting EOB per pt's request)     Verdell Face, PTA [phone] 08/22/2012

## 2012-08-22 NOTE — Clinical Social Work Placement (Signed)
     Clinical Social Work Department CLINICAL SOCIAL WORK PLACEMENT NOTE 08/22/2012  Patient:  Angie Wang, Angie Wang  Account Number:  [id number] Admit date:  08/18/2012  Clinical Social Worker:  Margaree Mackintosh  Date/time:  08/22/2012 12:00 M  Clinical Social Work is seeking post-discharge placement for this patient at the following level of care:   SKILLED NURSING   (*CSW will update this form in Epic as items are completed)   08/22/2012  Patient/family provided with [facility] Department of Clinical Social Works list of facilities offering this level of care within the geographic area requested by the patient (or if unable, by the patients family).  08/22/2012  Patient/family informed of their freedom to choose among providers that offer the needed level of care, that participate in Medicare, Medicaid or managed care program needed by the patient, have an available bed and are willing to accept the patient.  08/22/2012  Patient/family informed of [facility] ownership interest in [facility], as well as of the fact that they are under no obligation to receive care at this facility.  PASARR submitted to EDS on 08/22/2012 PASARR number received from EDS on   FL2 transmitted to all facilities in geographic area requested by pt/family on  08/22/2012 FL2 transmitted to all facilities within larger geographic area on   Patient informed that his/her managed care company has contracts with or will negotiate with  certain facilities, including the following:     Patient/family informed of bed offers received:   Patient chooses bed at  Physician recommends and patient chooses bed at    Patient to be transferred to  on   Patient to be transferred to facility by   The following physician request were entered in Epic:   Additional Comments:

## 2012-08-22 NOTE — Progress Notes (Signed)
ANTIBIOTIC CONSULT NOTE - FOLLOW UP  Pharmacy Consult for vancomycin Indication: sepsis/cellulitis  No Known Allergies  Patient Measurements: Height: 5\' 2"  (157.5 cm) Weight: 251 lb 14.4 oz (114.261 kg) IBW/kg (Calculated) : 50.1    Vital Signs: Temp: 98.9 F (37.2 C) (10/23 0630) Temp src: Oral (10/23 0630) BP: 131/51 mmHg (10/23 0630) Pulse Rate: 74  (10/23 0630) Intake/Output from previous day: 10/22 0701 - 10/23 0700 In: -  Out: 800 [Urine:800] Intake/Output from this shift:    Labs:  Basename 08/22/12 0559 08/21/12 0618 08/20/12 1031  WBC -- 8.9 --  HGB -- 8.2* --  PLT -- 146* --  LABCREA -- -- --  CREATININE 1.45* 1.88* 2.16*   Estimated Creatinine Clearance: 46.9 ml/min (by C-G formula based on Cr of 1.45). No results found for this basename: VANCOTROUGH:2,VANCOPEAK:2,VANCORANDOM:2,GENTTROUGH:2,GENTPEAK:2,GENTRANDOM:2,TOBRATROUGH:2,TOBRAPEAK:2,TOBRARND:2,AMIKACINPEAK:2,AMIKACINTROU:2,AMIKACIN:2, in the last 72 hours   Microbiology: Recent Results (from the past 720 hour(s))  URINE CULTURE     Status: Normal   Collection Time   08/18/12 10:26 PM      Component Value Range Status Comment   Specimen Description URINE, RANDOM   Final    Special Requests [id number] ON [id number] @2348    Final    Culture  Setup Time 08/19/2012 17:13   Final    Colony Count NO GROWTH   Final    Culture NO GROWTH   Final    Report Status 08/20/2012 FINAL   Final   CULTURE, BLOOD (ROUTINE X 2)     Status: Normal (Preliminary result)   Collection Time   08/18/12 11:30 PM      Component Value Range Status Comment   Specimen Description BLOOD LEFT ARM   Final    Special Requests BOTTLES DRAWN AEROBIC ONLY 10CC   Final    Culture  Setup Time 08/19/2012 17:10   Final    Culture     Final    Value:        BLOOD CULTURE RECEIVED NO GROWTH TO DATE CULTURE WILL BE HELD FOR 5 DAYS BEFORE ISSUING A FINAL NEGATIVE REPORT   Report Status PENDING   Incomplete   CULTURE, BLOOD (ROUTINE X 2)      Status: Normal (Preliminary result)   Collection Time   08/18/12 11:36 PM      Component Value Range Status Comment   Specimen Description BLOOD RIGHT ARM   Final    Special Requests BOTTLES DRAWN AEROBIC ONLY 10CC   Final    Culture  Setup Time 08/19/2012 17:10   Final    Culture     Final    Value:        BLOOD CULTURE RECEIVED NO GROWTH TO DATE CULTURE WILL BE HELD FOR 5 DAYS BEFORE ISSUING A FINAL NEGATIVE REPORT   Report Status PENDING   Incomplete   WOUND CULTURE     Status: Normal   Collection Time   08/19/12  2:14 PM      Component Value Range Status Comment   Specimen Description WOUND LEFT FOREARM   Final    Special Requests NONE   Final    Gram Stain     Final    Value: FEW WBC PRESENT, PREDOMINANTLY PMN     NO SQUAMOUS EPITHELIAL CELLS SEEN     NO ORGANISMS SEEN   Culture MODERATE GROUP A STREP (S.PYOGENES) ISOLATED   Final    Report Status 08/21/2012 FINAL   Final     Anti-infectives     Start  Dose/Rate Route Frequency Ordered Stop   08/20/12 2000   vancomycin (VANCOCIN) 1,250 mg in sodium chloride 0.9 % 250 mL IVPB  Status:  Discontinued        1,250 mg 166.7 mL/hr over 90 Minutes Intravenous Every 48 hours 08/19/12 0459 08/19/12 1153   08/20/12 2000   vancomycin (VANCOCIN) 1,500 mg in sodium chloride 0.9 % 500 mL IVPB        1,500 mg 250 mL/hr over 120 Minutes Intravenous Every 48 hours 08/19/12 1153     08/19/12 0800   piperacillin-tazobactam (ZOSYN) IVPB 3.375 g  Status:  Discontinued        3.375 g 100 mL/hr over 30 Minutes Intravenous Every 8 hours 08/19/12 0434 08/19/12 0501   08/19/12 0800   piperacillin-tazobactam (ZOSYN) IVPB 3.375 g  Status:  Discontinued        3.375 g 12.5 mL/hr over 240 Minutes Intravenous Every 8 hours 08/19/12 0501 08/21/12 0833   08/19/12 0500   vancomycin (VANCOCIN) IVPB 1000 mg/200 mL premix        1,000 mg 200 mL/hr over 60 Minutes Intravenous  Once 08/19/12 0459 08/19/12 0621   08/19/12 0000   vancomycin (VANCOCIN)  1,500 mg in sodium chloride 0.9 % 500 mL IVPB        1,500 mg 250 mL/hr over 120 Minutes Intravenous  Once 08/18/12 2345 08/19/12 0248   08/19/12 0000   piperacillin-tazobactam (ZOSYN) IVPB 3.375 g        3.375 g 100 mL/hr over 30 Minutes Intravenous  Once 08/18/12 2345 08/19/12 0335          Assessment: 66 yo female admitted with sepsis secondary to cellulitis to continue on vancomycin. Pt is afebrile with WBC trending down from 18.2 to 8.9. Renal function improving, SCr trending down from 2.41 on admission to 1.45 today, CrCl ~ 47 ml/min. Will increase dose empirically to q24h based on nomogram.   Goal of Therapy:  Vancomycin trough level 15-20 mcg/ml  Plan:  1.  Increase Vancomycin to 1500 mg IV q24h  2.  Monitor renal function and clinical progression 3.  Vancomycin trough at steady state if clinically indicated   Nicolasa Ducking, PharmD Clinical Pharmacist Pgr [phone] 08/22/2012 9:58 AM

## 2012-08-22 NOTE — Progress Notes (Signed)
[facility] PROGRESS NOTE  Assessment/Plan: Cellulitis/fulliculitis of upper extremity:  Worsened after zosyn discontinued on 10/22 with erythema extending 2/3 up humerus whereas previously ended at elbow. -08/18/2012 Vancomycin Zosyn, zosyn d/c on 08/21/2012.  -  DC vancomycin as infection worsened despite continued administration - Zosyn restarted 10/23  -Wound Culture with GAS -> may narrow coverage tomorrow if erythema improved on zosyn - blood cultures 10/19 NGTD  Sepsis (08/19/2012) -Resolved. Most likely 2/2 cellulitis. -Bp medication held on admission. -continue to monitor Bp. -Urine culture continue to be negative d/c zosyn 08/21/2012. -CXR 08/20/2012 no infiltrate or edema.  Acute kidney injury:  Resolving.  Patient voiding after foley DC'd 10/22 -Cr trending down, unknown baseline. -strict I and O's.  Toxic encephalopathy : -Now resolved most likely secondary to cellulitis/folliculitis of the left upper extremity and sepsis.   Frequent falls (08/19/2012): -likely secondary to decreased Intravascular Volume. -Pt consult, no SNF.  DM (diabetes mellitus) (08/19/2012): -blood glucose improved controlled -continue SSI, lantus.  HTN (hypertension) (08/19/2012) -BP stable continue.  -continue to hold Bp meds.  Anemia (normocytic) and thrombocytopenia:  Likely due to sepsis, but may have some underlying chronic anemia.  Both are trending down, but likely due to rehydration -  Trend  Code Status: FULL CODE. Family Communication: patient  Disposition Plan: D/c home in 24-48hrs if improving    Consultants:  none  Procedures:  none  Antibiotics:  Vancomycin Angie Wang 08/18/2012, zosyn d/c 08/21/2012  HPI/Subjective:  Increased redness of arm today.    Objective: Filed Vitals:   08/21/12 2116 08/22/12 0630 08/22/12 1400 08/22/12 2100  BP: 129/55 131/51 114/70 138/61  Pulse: 79 74 69 69  Temp: 98.7 F (37.1 C) 98.9 F (37.2 C) 99.5 F (37.5 C)  98.6 F (37 C)  TempSrc: Oral Oral  Oral  Resp: 20 18 18    Height:      Weight:  114.261 kg (251 lb 14.4 oz)    SpO2: 100% 97% 98% 98%    Intake/Output Summary (Last 24 hours) at 08/22/12 2202 Last data filed at 08/22/12 1800  Gross per 24 hour  Intake   1450 ml  Output    776 ml  Net    674 ml   Filed Weights   08/19/12 0405 08/21/12 0458 08/22/12 0630  Weight: 109.09 kg (240 lb 8 oz) 118.298 kg (260 lb 12.8 oz) 114.261 kg (251 lb 14.4 oz)    Exam:  General: Alert, awake, oriented x3, in no acute distress.  HEENT: No bruits, no goiter.  Heart: Regular rate and rhythm, without murmurs, rubs, gallops.  Lungs: Good air movement, air movement. Abdomen: Soft, nontender, nondistended, positive bowel sounds.  Neuro: Grossly intact, nonfocal. Skin:  Erythema extends from back of hand to axillary line.  Drew line today to document where erythema ends.    Data Reviewed: Basic Metabolic Panel:  Lab 08/22/12 1610 08/21/12 0618 08/20/12 1031 08/19/12 0638 08/18/12 2222  NA 139 140 136 -- 131*  K 3.7 3.3* 3.6 -- 4.4  CL 107 108 104 -- 95*  CO2 22 20 22  -- 21  GLUCOSE 74 63* 243* -- 337*  BUN 26* 36* 38* -- 32*  CREATININE 1.45* 1.88* 2.16* 2.31* 2.41*  CALCIUM 8.5 8.0* 7.7* -- 9.9  MG -- -- -- -- --  PHOS -- -- -- -- --   Liver Function Tests:  Lab 08/18/12 2222  AST 41*  ALT 21  ALKPHOS 90  BILITOT 0.5  PROT 7.2  ALBUMIN 3.4*  No results found for this basename: LIPASE:5,AMYLASE:5 in the last 168 hours No results found for this basename: AMMONIA:5 in the last 168 hours CBC:  Lab 08/21/12 0618 08/19/12 0638 08/18/12 2338 08/18/12 2222  WBC 8.9 15.3* -- 18.2*  NEUTROABS -- -- 17.0* --  HGB 8.2* 9.8* -- 11.6*  HCT 24.8* 29.6* -- 34.5*  MCV 89.9 89.4 -- 90.3  PLT 146* 159 -- 191   Cardiac Enzymes:  Lab 08/19/12 1621 08/19/12 1023 08/19/12 0638 08/18/12 2223  CKTOTAL -- -- -- --  CKMB -- -- -- --  CKMBINDEX -- -- -- --  TROPONINI <0.30 <0.30 <0.30 <0.30    BNP (last 3 results) No results found for this basename: PROBNP:3 in the last 8760 hours CBG:  Lab 08/22/12 2024 08/22/12 1700 08/22/12 1129 08/22/12 0818 08/22/12 0749  GLUCAP 235* 178* 177* 98 66*    Recent Results (from the past 240 hour(s))  URINE CULTURE     Status: Normal   Collection Time   08/18/12 10:26 PM      Component Value Range Status Comment   Specimen Description URINE, RANDOM   Final    Special Requests ZO:XWRUE ON [id number] @2348    Final    Culture  Setup Time 08/19/2012 17:13   Final    Colony Count NO GROWTH   Final    Culture NO GROWTH   Final    Report Status 08/20/2012 FINAL   Final   CULTURE, BLOOD (ROUTINE X 2)     Status: Normal (Preliminary result)   Collection Time   08/18/12 11:30 PM      Component Value Range Status Comment   Specimen Description BLOOD LEFT ARM   Final    Special Requests BOTTLES DRAWN AEROBIC ONLY 10CC   Final    Culture  Setup Time 08/19/2012 17:10   Final    Culture     Final    Value:        BLOOD CULTURE RECEIVED NO GROWTH TO DATE CULTURE WILL BE HELD FOR 5 DAYS BEFORE ISSUING A FINAL NEGATIVE REPORT   Report Status PENDING   Incomplete   CULTURE, BLOOD (ROUTINE X 2)     Status: Normal (Preliminary result)   Collection Time   08/18/12 11:36 PM      Component Value Range Status Comment   Specimen Description BLOOD RIGHT ARM   Final    Special Requests BOTTLES DRAWN AEROBIC ONLY 10CC   Final    Culture  Setup Time 08/19/2012 17:10   Final    Culture     Final    Value:        BLOOD CULTURE RECEIVED NO GROWTH TO DATE CULTURE WILL BE HELD FOR 5 DAYS BEFORE ISSUING A FINAL NEGATIVE REPORT   Report Status PENDING   Incomplete   WOUND CULTURE     Status: Normal   Collection Time   08/19/12  2:14 PM      Component Value Range Status Comment   Specimen Description WOUND LEFT FOREARM   Final    Special Requests NONE   Final    Gram Stain     Final    Value: FEW WBC PRESENT, PREDOMINANTLY PMN     NO SQUAMOUS EPITHELIAL CELLS  SEEN     NO ORGANISMS SEEN   Culture MODERATE GROUP A STREP (S.PYOGENES) ISOLATED   Final    Report Status 08/21/2012 FINAL   Final      Studies: No results found.  Scheduled  Meds:    . docusate sodium  100 mg Oral BID  . enoxaparin (LOVENOX) injection  40 mg Subcutaneous Q24H  . folic acid-pyridoxine-cyancobalamin  1 tablet Oral Daily  . gabapentin  600 mg Oral BID  . glimepiride  4 mg Oral BID WC  . insulin aspart  0-20 Units Subcutaneous TID WC  . insulin aspart  0-5 Units Subcutaneous QHS  . insulin detemir  70 Units Subcutaneous QHS  . metoprolol tartrate  12.5 mg Oral BID  . pantoprazole  80 mg Oral Q1200  . PARoxetine  40 mg Oral Daily  . piperacillin-tazobactam (ZOSYN)  IV  3.375 g Intravenous Q8H  . sodium chloride  3 mL Intravenous Q12H  . vancomycin  1,500 mg Intravenous Q24H  . Vitamin D (Ergocalciferol)  50,000 Units Oral Q7 days  . zolpidem  5 mg Oral Once  . DISCONTD: vancomycin  1,500 mg Intravenous Q48H   Continuous Infusions:     Angie Wang, Angie Wang  [facility] Pager [phone]. If 8PM-8AM, please contact night-coverage at [email or web address], password [facility] 08/22/2012, 10:02 PM  LOS: 4 days

## 2012-08-22 NOTE — Progress Notes (Signed)
ANTIBIOTIC CONSULT NOTE - FOLLOW UP  Pharmacy Consult for vancomycin/Zosyn Indication: sepsis/cellulitis  No Known Allergies  Patient Measurements: Height: 5\' 2"  (157.5 cm) Weight: 251 lb 14.4 oz (114.261 kg) IBW/kg (Calculated) : 50.1    Vital Signs: Temp: 98.9 F (37.2 C) (10/23 0630) Temp src: Oral (10/23 0630) BP: 131/51 mmHg (10/23 0630) Pulse Rate: 74  (10/23 0630) Intake/Output from previous day: 10/22 0701 - 10/23 0700 In: -  Out: 800 [Urine:800] Intake/Output from this shift: Total I/O In: 360 [P.O.:360] Out: 201 [Urine:200; Stool:1]  Labs:  [facility] 08/22/12 0559 08/21/12 0618 08/20/12 1031  WBC -- 8.9 --  HGB -- 8.2* --  PLT -- 146* --  LABCREA -- -- --  CREATININE 1.45* 1.88* 2.16*   Estimated Creatinine Clearance: 46.9 ml/min (by C-G formula based on Cr of 1.45).   Microbiology: Recent Results (from the past 720 hour(s))  URINE CULTURE     Status: Normal   Collection Time   08/18/12 10:26 PM      Component Value Range Status Comment   Specimen Description URINE, RANDOM   Final    Special Requests ZH:YQMVH ON [id number] @2348    Final    Culture  Setup Time 08/19/2012 17:13   Final    Colony Count NO GROWTH   Final    Culture NO GROWTH   Final    Report Status 08/20/2012 FINAL   Final   CULTURE, BLOOD (ROUTINE X 2)     Status: Normal (Preliminary result)   Collection Time   08/18/12 11:30 PM      Component Value Range Status Comment   Specimen Description BLOOD LEFT ARM   Final    Special Requests BOTTLES DRAWN AEROBIC ONLY 10CC   Final    Culture  Setup Time 08/19/2012 17:10   Final    Culture     Final    Value:        BLOOD CULTURE RECEIVED NO GROWTH TO DATE CULTURE WILL BE HELD FOR 5 DAYS BEFORE ISSUING A FINAL NEGATIVE REPORT   Report Status PENDING   Incomplete   CULTURE, BLOOD (ROUTINE X 2)     Status: Normal (Preliminary result)   Collection Time   08/18/12 11:36 PM      Component Value Range Status Comment   Specimen Description BLOOD  RIGHT ARM   Final    Special Requests BOTTLES DRAWN AEROBIC ONLY 10CC   Final    Culture  Setup Time 08/19/2012 17:10   Final    Culture     Final    Value:        BLOOD CULTURE RECEIVED NO GROWTH TO DATE CULTURE WILL BE HELD FOR 5 DAYS BEFORE ISSUING A FINAL NEGATIVE REPORT   Report Status PENDING   Incomplete   WOUND CULTURE     Status: Normal   Collection Time   08/19/12  2:14 PM      Component Value Range Status Comment   Specimen Description WOUND LEFT FOREARM   Final    Special Requests NONE   Final    Gram Stain     Final    Value: FEW WBC PRESENT, PREDOMINANTLY PMN     NO SQUAMOUS EPITHELIAL CELLS SEEN     NO ORGANISMS SEEN   Culture MODERATE GROUP A STREP (S.PYOGENES) ISOLATED   Final    Report Status 08/21/2012 FINAL   Final      Assessment: 66 yo female admitted with sepsis secondary to cellulitis to continue  on vancomycin. Pt is afebrile with WBC trending down from 18.2 to 8.9. Renal function improving, SCr trending down from 2.41 on admission to 1.45 today, CrCl ~ 47 ml/min. Vancomycin dose increased today due to improving renal function.  MD wants Zosyn added back for worsening cellulitis.  Group A strep identified in wound culture.  This organism is one of the most likely pathogens of cellulitis and is always penicillin sensitive.  Goal of Therapy:  Vancomycin trough level 15-20 mcg/ml  Plan:  1.  Increase Vancomycin to 1500 mg IV q24h  2.  Monitor renal function and clinical progression 3.  Vancomycin trough at steady state if clinically indicated 4.  Zosyn 3.375g IV q8h (infuse over 4 hours) 5.  Would consider de-escalation to cefazolin or ceftriaxone soon    Celedonio Miyamoto, PharmD, [facility] Clinical Pharmacist Pager [phone]  08/22/2012 4:09 PM

## 2012-08-23 ENCOUNTER — Encounter (HOSPITAL_COMMUNITY): Payer: Self-pay | Admitting: General Practice

## 2012-08-23 LAB — BASIC METABOLIC PANEL
BUN: 19 mg/dL (ref 6–23)
Chloride: 106 mEq/L (ref 96–112)
Creatinine, Ser: 1.15 mg/dL — ABNORMAL HIGH (ref 0.50–1.10)
GFR calc Af Amer: 57 mL/min — ABNORMAL LOW (ref >90)
GFR calc non Af Amer: 49 mL/min — ABNORMAL LOW (ref >90)
Potassium: 4.1 mEq/L (ref 3.5–5.1)

## 2012-08-23 LAB — CBC
HCT: 26.6 % — ABNORMAL LOW (ref 36.0–46.0)
MCHC: 32.3 g/dL (ref 30.0–36.0)
Platelets: 199 10*3/uL (ref 150–400)
RDW: 14.7 % (ref 11.5–15.5)
WBC: 6.3 10*3/uL (ref 4.0–10.5)

## 2012-08-23 LAB — GLUCOSE, CAPILLARY
Glucose-Capillary: 153 mg/dL — ABNORMAL HIGH (ref 70–99)
Glucose-Capillary: 163 mg/dL — ABNORMAL HIGH (ref 70–99)
Glucose-Capillary: 132 mg/dL — ABNORMAL HIGH (ref 70–99)

## 2012-08-23 MED ORDER — DEXTROSE 5 % IV SOLN
2.0000 g | INTRAVENOUS | Status: DC
  Administered 2012-08-23 – 2012-08-24 (×2): 2 g via INTRAVENOUS
  Filled 2012-08-23 (×2): qty 2

## 2012-08-23 MED ORDER — SULFAMETHOXAZOLE-TMP DS 800-160 MG PO TABS
2.0000 | ORAL_TABLET | Freq: Two times a day (BID) | ORAL | Status: DC
  Administered 2012-08-23 – 2012-08-24 (×3): 2 via ORAL
  Filled 2012-08-23 (×5): qty 2

## 2012-08-23 NOTE — Progress Notes (Signed)
[facility] PROGRESS NOTE  Assessment/Plan: Cellulitis/fulliculitis of upper extremity:  Worsened after zosyn discontinued on 10/22 with erythema extending 2/3 up humerus whereas previously ended at elbow.  Patient has history of MRSA, but currently nares are negative and she has only GAS growing from her wound culture - Bactrim DS 2 tabs bid for MRSA coverage  - Narrow Zosyn to ceftriaxone 2gm once daily  - blood cultures 10/19 NGTD -  Elevate extremity  Sepsis (08/19/2012),  Urine culture was negative and CXR demonstrated no infiltrate or edema -Resolved. Most likely 2/2 cellulitis. -see above for antibiotics  Acute kidney injury:  Resolving.  Patient voiding after foley DC'd 10/22 -Cr trending down, unknown baseline. -strict I and O's.  Toxic encephalopathy :  Resolved.  Most likely secondary to cellulitis/folliculitis of the left upper extremity and sepsis.   Frequent falls (08/19/2012):  Likely secondary to decreased Intravascular Volume and acute illness.    DM (diabetes mellitus) (08/19/2012):  Stable FS 141-235 -continue SSI, lantus.  HTN (hypertension) (08/19/2012), BP stable  -continue to hold Bp meds  Normocytic anemia (stable) and thrombocytopenia (resolved):  Likely due to sepsis, but may have some underlying chronic anemia.  Both trended down 10/23, likely due to rehydration.  Thrombocytopenia resolved 10/24.   -  Hemoglobin currently stable and no need for blood transfusion at this time -  Will need repeat CBC as outpatient and anemia evaluation if not already completed by primary care physician.  Code Status: FULL CODE. Family Communication: patient  Disposition Plan: D/c to SNF tomorrow if continuing to improve on ceftriaxone and bactrim on keflex and bactrim   Consultants:  none  Procedures:  none  Antibiotics:  Vancomycin Laqueta Jean 08/18/2012, zosyn d/c 08/21/2012  Vanco d/c 10/23  Zosyn 10/23>>10/24  Ceftriaxone 10/24>>  Bactrim  10/24>>  HPI/Subjective:  Patient states that she feels well.  She states the redness of her arm is improved today.    Objective: Filed Vitals:   08/22/12 2100 08/22/12 2240 08/23/12 0500 08/23/12 1432  BP: 138/61  114/55 125/93  Pulse: 69 75 65 61  Temp: 98.6 F (37 C)  98.2 F (36.8 C) 98.3 F (36.8 C)  TempSrc: Oral  Oral Oral  Resp:    16  Height:      Weight:      SpO2: 98%  100% 100%    Intake/Output Summary (Last 24 hours) at 08/23/12 1509 Last data filed at 08/23/12 1434  Gross per 24 hour  Intake    590 ml  Output      0 ml  Net    590 ml   Filed Weights   08/19/12 0405 08/21/12 0458 08/22/12 0630  Weight: 109.09 kg (240 lb 8 oz) 118.298 kg (260 lb 12.8 oz) 114.261 kg (251 lb 14.4 oz)    Exam:  General: Alert, awake, oriented x4 in no acute distress.  HEENT: MMM  Heart: Regular rate and rhythm, without murmurs, rubs, gallops.  Lungs: Good air movement, CTAB Abdomen: Soft, nontender, nondistended, positive bowel sounds.  Neuro: Grossly intact, nonfocal. Skin:  Erythema with induration extends from MCP joints up arm to 4cm below line drawn on 10/23.  Multiple deep persistent ulcerations with copious yellow green discharge on left arm.    Data Reviewed: Basic Metabolic Panel:  Lab 08/23/12 1610 08/22/12 0559 08/21/12 0618 08/20/12 1031 08/19/12 0638 08/18/12 2222  NA 137 139 140 136 -- 131*  K 4.1 3.7 3.3* 3.6 -- 4.4  CL 106 107 108 104 --  95*  CO2 22 22 20 22  -- 21  GLUCOSE 172* 74 63* 243* -- 337*  BUN 19 26* 36* 38* -- 32*  CREATININE 1.15* 1.45* 1.88* 2.16* 2.31* --  CALCIUM 8.8 8.5 8.0* 7.7* -- 9.9  MG -- -- -- -- -- --  PHOS -- -- -- -- -- --   Liver Function Tests:  Lab 08/18/12 2222  AST 41*  ALT 21  ALKPHOS 90  BILITOT 0.5  PROT 7.2  ALBUMIN 3.4*   No results found for this basename: LIPASE:5,AMYLASE:5 in the last 168 hours No results found for this basename: AMMONIA:5 in the last 168 hours CBC:  Lab 08/23/12 0605 08/21/12 0618  08/19/12 0638 08/18/12 2338 08/18/12 2222  WBC 6.3 8.9 15.3* -- 18.2*  NEUTROABS -- -- -- 17.0* --  HGB 8.6* 8.2* 9.8* -- 11.6*  HCT 26.6* 24.8* 29.6* -- 34.5*  MCV 90.2 89.9 89.4 -- 90.3  PLT 199 146* 159 -- 191   Cardiac Enzymes:  Lab 08/19/12 1621 08/19/12 1023 08/19/12 0638 08/18/12 2223  CKTOTAL -- -- -- --  CKMB -- -- -- --  CKMBINDEX -- -- -- --  TROPONINI <0.30 <0.30 <0.30 <0.30   BNP (last 3 results) No results found for this basename: PROBNP:3 in the last 8760 hours CBG:  Lab 08/23/12 1201 08/23/12 0753 08/22/12 2024 08/22/12 1700 08/22/12 1129  GLUCAP 141* 153* 235* 178* 177*    Recent Results (from the past 240 hour(s))  URINE CULTURE     Status: Normal   Collection Time   08/18/12 10:26 PM      Component Value Range Status Comment   Specimen Description URINE, RANDOM   Final    Special Requests ZO:XWRUE ON [id number] @2348    Final    Culture  Setup Time 08/19/2012 17:13   Final    Colony Count NO GROWTH   Final    Culture NO GROWTH   Final    Report Status 08/20/2012 FINAL   Final   CULTURE, BLOOD (ROUTINE X 2)     Status: Normal (Preliminary result)   Collection Time   08/18/12 11:30 PM      Component Value Range Status Comment   Specimen Description BLOOD LEFT ARM   Final    Special Requests BOTTLES DRAWN AEROBIC ONLY 10CC   Final    Culture  Setup Time 08/19/2012 17:10   Final    Culture     Final    Value:        BLOOD CULTURE RECEIVED NO GROWTH TO DATE CULTURE WILL BE HELD FOR 5 DAYS BEFORE ISSUING A FINAL NEGATIVE REPORT   Report Status PENDING   Incomplete   CULTURE, BLOOD (ROUTINE X 2)     Status: Normal (Preliminary result)   Collection Time   08/18/12 11:36 PM      Component Value Range Status Comment   Specimen Description BLOOD RIGHT ARM   Final    Special Requests BOTTLES DRAWN AEROBIC ONLY 10CC   Final    Culture  Setup Time 08/19/2012 17:10   Final    Culture     Final    Value:        BLOOD CULTURE RECEIVED NO GROWTH TO DATE CULTURE WILL  BE HELD FOR 5 DAYS BEFORE ISSUING A FINAL NEGATIVE REPORT   Report Status PENDING   Incomplete   WOUND CULTURE     Status: Normal   Collection Time   08/19/12  2:14 PM  Component Value Range Status Comment   Specimen Description WOUND LEFT FOREARM   Final    Special Requests NONE   Final    Gram Stain     Final    Value: FEW WBC PRESENT, PREDOMINANTLY PMN     NO SQUAMOUS EPITHELIAL CELLS SEEN     NO ORGANISMS SEEN   Culture MODERATE GROUP A STREP (S.PYOGENES) ISOLATED   Final    Report Status 08/21/2012 FINAL   Final      Studies: No results found.  Scheduled Meds:    . cefTRIAXone (ROCEPHIN)  IV  2 g Intravenous Q24H  . docusate sodium  100 mg Oral BID  . enoxaparin (LOVENOX) injection  40 mg Subcutaneous Q24H  . folic acid-pyridoxine-cyancobalamin  1 tablet Oral Daily  . gabapentin  600 mg Oral BID  . glimepiride  4 mg Oral BID WC  . insulin aspart  0-20 Units Subcutaneous TID WC  . insulin aspart  0-5 Units Subcutaneous QHS  . insulin detemir  60 Units Subcutaneous QHS  . metoprolol tartrate  12.5 mg Oral BID  . pantoprazole  80 mg Oral Q1200  . PARoxetine  40 mg Oral Daily  . sodium chloride  3 mL Intravenous Q12H  . sulfamethoxazole-trimethoprim  2 tablet Oral Q12H  . Vitamin D (Ergocalciferol)  50,000 Units Oral Q7 days  . DISCONTD: insulin detemir  70 Units Subcutaneous QHS  . DISCONTD: piperacillin-tazobactam (ZOSYN)  IV  3.375 g Intravenous Q8H  . DISCONTD: vancomycin  1,500 mg Intravenous Q24H   Continuous Infusions:     Renae Fickle  [facility] Pager [phone]. If 8PM-8AM, please contact night-coverage at [email or web address], password [facility] 08/23/2012, 3:09 PM  LOS: 5 days

## 2012-08-24 LAB — BASIC METABOLIC PANEL
Calcium: 9.2 mg/dL (ref 8.4–10.5)
Creatinine, Ser: 1.12 mg/dL — ABNORMAL HIGH (ref 0.50–1.10)
GFR calc non Af Amer: 51 mL/min — ABNORMAL LOW (ref >90)
Sodium: 139 mEq/L (ref 135–145)

## 2012-08-24 LAB — CBC
MCH: 29.7 pg (ref 26.0–34.0)
MCHC: 33.3 g/dL (ref 30.0–36.0)
MCV: 89.2 fL (ref 78.0–100.0)
Platelets: 247 10*3/uL (ref 150–400)

## 2012-08-24 LAB — GLUCOSE, CAPILLARY: Glucose-Capillary: 164 mg/dL — ABNORMAL HIGH (ref 70–99)

## 2012-08-24 MED ORDER — INSULIN DETEMIR 100 UNIT/ML ~~LOC~~ SOLN: 50.0000 [IU] | Freq: Every day | SUBCUTANEOUS | Status: AC

## 2012-08-24 MED ORDER — INSULIN ASPART 100 UNIT/ML ~~LOC~~ SOLN: 0.0000 [IU] | Freq: Three times a day (TID) | SUBCUTANEOUS | Status: AC

## 2012-08-24 MED ORDER — INSULIN DETEMIR 100 UNIT/ML ~~LOC~~ SOLN: 50.0000 [IU] | Freq: Every day | SUBCUTANEOUS | Status: DC

## 2012-08-24 MED ORDER — TRAMADOL HCL 50 MG PO TABS: 50.0000 mg | ORAL_TABLET | Freq: Three times a day (TID) | ORAL | Status: DC | PRN

## 2012-08-24 MED ORDER — SULFAMETHOXAZOLE-TMP DS 800-160 MG PO TABS: 2.0000 | ORAL_TABLET | Freq: Two times a day (BID) | ORAL | Status: DC

## 2012-08-24 MED ORDER — ACETAMINOPHEN 500 MG PO TABS: 500.0000 mg | ORAL_TABLET | Freq: Four times a day (QID) | ORAL | Status: DC | PRN

## 2012-08-24 MED ORDER — CEPHALEXIN 500 MG PO CAPS: 500.0000 mg | ORAL_CAPSULE | Freq: Four times a day (QID) | ORAL | Status: DC

## 2012-08-24 NOTE — Progress Notes (Addendum)
Clinical Child psychotherapist met with pt at bedside; pt agreeable to SNF at [facility].  CSW spoke with Selena Batten at [facility] who is agreeable to pt being admitted today.  Per request of pt, CSW updated pt's nephew, Loraine Leriche.  Mark to phone facility and attempt to transport pt.  Mark to phone CSW back if unable to locate transportation.  2:25pm, CSW received phone call from [place] who will not be able to provide transportation for pt due to his own medical appointments.  CSW to continue to follow and assist as needed.   Angelia Mould, MSW, [place] [phone]

## 2012-08-24 NOTE — Progress Notes (Signed)
Clinical Social Worker met with pt at bedside.  CSW reviewed bed offers, pt agreeable to SNF potentially at [facility] or [facility] (pt is requesting a private room).  CSW phoned [facility], they are currently reviewing her information and will follow up with CSW.  CSW to continue to follow and assist as needed.  Of note, pt expressed interest in dc'ing home.  CSW reviewed PT and MD recommendations for pt to have 24 hr supervision.  Pt is phoning her friend, "Windell Moulding", to inquire if Windell Moulding will be able to stay with pt post dc from hospital.   Angelia Mould, MSW, [facility] [phone]

## 2012-08-24 NOTE — Discharge Summary (Addendum)
Physician Discharge Summary  Angie Wang YQM:[id number] DOB: April 11, 1946 DOA: 08/18/2012  PCP: Angie Cirri, DO  Admit date: 08/18/2012 Discharge date: 08/24/2012  Recommendations for Outpatient Follow-up:  1. Primary care doctor within 1 weeks of discharge for repeat examination, BMP and CBC, anemia work up if not already complete, mental status examination.  2. Blood pressure and weight check in one week to determine if lasix and beta blocker should be reinitiated.   3. Continue antibiotics, next dose evening of 10/25 for bactrim and keflex.  Continue for 8 days, last day on 11/2. 4. May given fluconazole 150mg  tab 1 tab po once if yeast infection from antibiotics.    Discharge Diagnoses:  Principal Problem:  *Cellulitis of upper extremity Active Problems:  Polypharmacy  Toxic encephalopathy  Frequent falls  DM (diabetes mellitus)  HTN (hypertension)  OSA (obstructive sleep apnea)  Acute kidney injury   Discharge Condition: stable, improving  Diet recommendation: healthy heart, diabetic   Wt Readings from Last 3 Encounters:  08/24/12 115.667 kg (255 lb)    History of present illness:  Angie Wang is an 66 y.o. female with hx of DM, HTN, IBS, OSA living at home alone with help from nephew and brother, brought to the ER as she was found unconscious in the bathroom. She has been weak and was having fever. She was found at time agitated. She has hx of frequent falls, and has several abrasion on her left arm. There has been no headaches, stiff neck, nausea or vomiting. She was hypotensive on initial presentation and has fever of 102.9. Her CXR and her UA showed no evidence of infection. She was given IV fluid bolus with improvement of her BP from 80 to 120. Her UDS showed the presence of benzodiazepine and amphetamine. Her medication list included no amphetamine. She denied any drug use. She was also found to have leukocytosis with WBC of 18K, lactic acid was elevated at 2.8.  After PCCM was consulted, hospitalist was asked to admit her for AMS, sepsis, and volume depletion.    Hospital Course:   Angie Wang was hospitalized with septic shock secondary to a left upper extremity cellulitis.  She was started on vancomycin and zosyn pending results of cultures and given IVF.  Her urine culture and CXR were negative.  Her blood cultures remained negative.  Her blood pressure improved.  On 10/22, her zosyn was discontinued because her urine culture was negative, however, her left upper extremity cellulitis worsened and it was restarted on 10/23.  On 10/24 her vancomycin was converted to bactrim and because her left arm wound culture grew group A strep, her zosyn was narrowed to ceftriaxone.  Her arm continued to show improvement in erythema, induration.  She had Mepilex applied to her left upper extremity ulcers once or twice a day as needed for drainage.  She should continue this therapy and follow up in the wound care clinic within 1-2 weeks if not improving.  She should continue to elevate the left upper extremity.    Acute kidney injury was present at admission with peak creatinine of 2.41, which was likely secondary to sepsis.  Her creatinine trended down to 1.12 on the day of discharge.  Her lasix and lisinopril were held.  Lisinopril restarted on day of discharge.    Toxic encephalopathy:  The patient was very confused when she was initially admitted to the hospital and her thinking has slowly been improving, however, she is not back to her baseline.  She frequently forgets  conversations and visits and occasionally is not oriented to place or time.  She is alert.  Some of her confusion may be due to hospital delirium and infection, but question whether she has some underlying early dementia and she has evidence of chronic microvascular changes on CT head.  After conversations with social work and the patient it was felt she would benefit from 24 hour supervision at a nursing  facility until she is more fully recovered from her illness.  Anticipate that she will be able to go home pending further improvement in her infection.    Frequent falls.  Likely secondary to decreased Intravascular Volume and acute illness and confusion early in admission.  No recent falls.  CT head and neck were negative for fracture and hemorrhage.  Clonazepam was discontinued secondary to altered mental status and high falls risk.    Diabetes mellitus:  Fingersticks were stable 141-235 yesterday, but today the morning fingerstick was 79.  Will decrease lantus dose from 60 units to 50 units to start on 10/25.  Hypertension.  Patient was hypotensive during early admission and her blood pressure medications were held.  Blood pressures over the last 24 hours have ranged from 122 -141 systolic. She may resume her lisinopril-HCTZ combination at discharge, but her metoprolol and lasix were held until she has a repeat blood pressure and weight by her PCP in one week.    She developed a normocytic anemia and thrombocytopenia.  Both were likely due to sepsis, but she may have some underlying chronic anemia.  Platelets nadired at 146 and thrombocytopenia resolved 10/24.  Hemoglobin decreased from 11.6 (hemoconcentrated) to the mid 8 range where it stabilzed.  She will need repeat CBC as outpatient and anemia evaluation if not already completed by primary care physician.  Procedures:  CT head and neck 10/19  Consultations:  None  Discharge Exam: Filed Vitals:   08/24/12 1330  BP: 141/86  Pulse: 73  Temp: 98.4 F (36.9 C)  Resp: 18   Filed Vitals:   08/23/12 2341 08/24/12 0500 08/24/12 1005 08/24/12 1330  BP: 117/59 154/57 122/60 141/86  Pulse: 70 65 77 73  Temp:  98 F (36.7 C)  98.4 F (36.9 C)  TempSrc:  Oral  Oral  Resp:    18  Height:      Weight:  115.667 kg (255 lb)    SpO2:  99%  99%   General: Alert, awake, oriented to person and place but not time.  Does not remember my visit  from yesterday. in no acute distress.  Pleasant HEENT: MMM  Heart: Regular rate and rhythm, without murmurs, rubs, gallops.  Lungs: Good air movement, CTAB  Abdomen: Soft, nontender, nondistended, positive bowel sounds.  Neuro: Grossly intact, nonfocal.  Skin: Erythema with induration extends from MCP joints up arm to mid upper arm.  Multiple deep persistent ulcerations with copious serous to yellow green discharge on left forearm.   Discharge Instructions      Discharge Orders    Future Orders Please Complete By Expires   Diet - low sodium heart healthy      Diet Carb Modified      Increase activity slowly      Discharge instructions      Comments:   You were hospitalized with sepsis and severe cellulitis of the left arm due in part to group A streptococcus.  You were given broad spectrum antibiotics with vancomycin and zosyn and you improved.  You should take 8 more  days of antibiotics with bactrim and keflex and continue using mepilex dressings on your arm as needed for drainage.  Elevate the right arm above the level of the heart as often as possible.  Your diabetes insulin was decreased slightly.  Your blood pressure medications were discontinued because your blood pressure was low.  You should restart your lisinopril-HCTZ, but please talk to your primary care doctor about starting your lasix and metoprolol. Because your thinking wasn't always clear, many of the medications you had been taking for sleep and itching were discontinued.  You may gradually restart these as an outpatient if needed.  Please talk to your primary care doctor about your anemia.  Follow up with your primary care doctor in one week.   Discharge wound care:      Comments:   Mepilex as needed to left arm wounds once daily or more frequently as needed.   Call MD for:      Comments:   Call 911 if you have chest pain, shortness of breath, numbness or weakness of an arm or leg, slurred speech, confusion, facial droop.    Please seek immediate medical attention if the redness and swelling of the left arm worsen.   Call MD for:  temperature >100.4      Call MD for:  severe uncontrolled pain      Call MD for:  persistant nausea and vomiting      Call MD for:  difficulty breathing, headache or visual disturbances      Call MD for:  hives      Call MD for:  persistant dizziness or light-headedness      Call MD for:  extreme fatigue      (HEART FAILURE PATIENTS) Call MD:  Anytime you have any of the following symptoms: 1) 3 pound weight gain in 24 hours or 5 pounds in 1 week 2) shortness of breath, with or without a dry hacking cough 3) swelling in the hands, feet or stomach 4) if you have to sleep on extra pillows at night in order to breathe.          Medication List     As of 08/24/2012  3:31 PM    STOP taking these medications         clonazePAM 1 MG tablet   Commonly known as: KLONOPIN      cyclobenzaprine 10 MG tablet   Commonly known as: FLEXERIL      furosemide 80 MG tablet   Commonly known as: LASIX      hydrOXYzine 25 MG tablet   Commonly known as: ATARAX/VISTARIL      insulin aspart protamine-insulin aspart (70-30) 100 UNIT/ML injection   Commonly known as: NOVOLOG 70/30      promethazine 25 MG tablet   Commonly known as: PHENERGAN      traZODone 100 MG tablet   Commonly known as: DESYREL      TAKE these medications         acetaminophen 500 MG tablet   Commonly known as: TYLENOL   Take 1 tablet (500 mg total) by mouth every 6 (six) hours as needed for pain.      albuterol 108 (90 BASE) MCG/ACT inhaler   Commonly known as: PROVENTIL HFA;VENTOLIN HFA   Inhale 2 puffs into the lungs every 6 (six) hours as needed. For shortness of breath      amitriptyline 25 MG tablet   Commonly known as: ELAVIL   Take 25 mg by  mouth at bedtime.      celecoxib 200 MG capsule   Commonly known as: CELEBREX   Take 200 mg by mouth 2 (two) times daily.      cephALEXin 500 MG capsule   Commonly  known as: KEFLEX   Take 1 capsule (500 mg total) by mouth 4 (four) times daily.      esomeprazole 40 MG capsule   Commonly known as: NEXIUM   Take 40 mg by mouth daily before breakfast.      exenatide 5 MCG/0.02ML Soln   Commonly known as: BYETTA   Inject 5 mcg into the skin 2 (two) times daily with a meal.      Folic Acid-Vit B6-Vit B12 2.5-25-1 MG Tabs   Commonly known as: FOLBEE   Take 1 tablet by mouth daily.      gabapentin 600 MG tablet   Commonly known as: NEURONTIN   Take 600 mg by mouth 2 (two) times daily.      glimepiride 4 MG tablet   Commonly known as: AMARYL   Take 4 mg by mouth 2 (two) times daily.      insulin aspart 100 UNIT/ML injection   Commonly known as: novoLOG   Inject 0-20 Units into the skin 3 (three) times daily with meals.      insulin detemir 100 UNIT/ML injection   Commonly known as: LEVEMIR   Inject 50 Units into the skin at bedtime.      lisinopril-hydrochlorothiazide 20-25 MG per tablet   Commonly known as: PRINZIDE,ZESTORETIC   Take 1 tablet by mouth daily.      metFORMIN 1000 MG tablet   Commonly known as: GLUCOPHAGE   Take 1,000 mg by mouth 2 (two) times daily with a meal.      PARoxetine 40 MG tablet   Commonly known as: PAXIL   Take 40 mg by mouth every morning.      sulfamethoxazole-trimethoprim 800-160 MG per tablet   Commonly known as: BACTRIM DS   Take 2 tablets by mouth every 12 (twelve) hours.      traMADol 50 MG tablet   Commonly known as: ULTRAM   Take 1 tablet (50 mg total) by mouth every 8 (eight) hours as needed for pain.      Vitamin D (Ergocalciferol) 50000 UNITS Caps   Commonly known as: DRISDOL   Take 50,000 Units by mouth every 7 (seven) days. On Sunday         Follow-up Information    Follow up with [facility], DO. Schedule an appointment as soon as possible for a visit in 1 week.   Contact information:   [address] [place] [address] [phone]           The results of  significant diagnostics from this hospitalization (including imaging, microbiology, ancillary and laboratory) are listed below for reference.    Significant Diagnostic Studies: Ct Head Wo Contrast  08/18/2012  *RADIOLOGY REPORT*  Clinical Data:  Multiple falls, altered mental status  CT HEAD WITHOUT CONTRAST CT CERVICAL SPINE WITHOUT CONTRAST  Technique:  Multidetector CT imaging of the head and cervical spine was performed following the standard protocol without intravenous contrast.  Multiplanar CT image reconstructions of the cervical spine were also generated.  Comparison:  MRI brain dated 09/15/2009  CT HEAD  Findings: Severely motion degraded images.  No evidence of parenchymal hemorrhage or extra-axial fluid collection. No mass lesion, mass effect, or midline shift.  No CT evidence of acute infarction. Subcortical  white matter and periventricular small vessel ischemic changes.  Global cortical atrophy.  No ventriculomegaly.  The visualized paranasal sinuses are essentially clear. The mastoid air cells are unopacified.  No evidence of calvarial fracture.  IMPRESSION: Severely motion degraded images.  No evidence of acute intracranial abnormality.  Atrophy with small vessel ischemic changes and intracranial atherosclerosis.  CT CERVICAL SPINE  Findings: Motion degraded images.  Straightening of the cervical spine, possibly positional.  No evidence of fracture or dislocation.  The vertebral body heights are maintained.  Dens appears intact.  No prevertebral soft tissue swelling.  Mild multilevel degenerative changes.  Visualized right thyroid is enlarged/nodular.  Visualized lung apices are essentially clear.  IMPRESSION: No evidence of traumatic injury to the cervical spine.  Mild multilevel degenerative changes.   Original Report Authenticated By: Charline Bills, M.D.    Ct Cervical Spine Wo Contrast  08/18/2012  *RADIOLOGY REPORT*  Clinical Data:  Multiple falls, altered mental status  CT HEAD  WITHOUT CONTRAST CT CERVICAL SPINE WITHOUT CONTRAST  Technique:  Multidetector CT imaging of the head and cervical spine was performed following the standard protocol without intravenous contrast.  Multiplanar CT image reconstructions of the cervical spine were also generated.  Comparison:  MRI brain dated 09/15/2009  CT HEAD  Findings: Severely motion degraded images.  No evidence of parenchymal hemorrhage or extra-axial fluid collection. No mass lesion, mass effect, or midline shift.  No CT evidence of acute infarction. Subcortical white matter and periventricular small vessel ischemic changes.  Global cortical atrophy.  No ventriculomegaly.  The visualized paranasal sinuses are essentially clear. The mastoid air cells are unopacified.  No evidence of calvarial fracture.  IMPRESSION: Severely motion degraded images.  No evidence of acute intracranial abnormality.  Atrophy with small vessel ischemic changes and intracranial atherosclerosis.  CT CERVICAL SPINE  Findings: Motion degraded images.  Straightening of the cervical spine, possibly positional.  No evidence of fracture or dislocation.  The vertebral body heights are maintained.  Dens appears intact.  No prevertebral soft tissue swelling.  Mild multilevel degenerative changes.  Visualized right thyroid is enlarged/nodular.  Visualized lung apices are essentially clear.  IMPRESSION: No evidence of traumatic injury to the cervical spine.  Mild multilevel degenerative changes.   Original Report Authenticated By: Charline Bills, M.D.    Dg Chest Port 1 View  08/20/2012  *RADIOLOGY REPORT*  Clinical Data: Kein Carlberg of breath, weakness, pulmonary edema  PORTABLE CHEST - 1 VIEW  Comparison: 08/18/2012  Findings: Low lung volumes.  Lungs are essentially clear.  No pleural effusion or pneumothorax.  The heart is top normal in size.  IMPRESSION: No evidence of acute cardiopulmonary disease.  Low lung volumes.   Original Report Authenticated By: Charline Bills, M.D.     Dg Chest Port 1 View  08/18/2012  *RADIOLOGY REPORT*  Clinical Data: Altered mental status.  Fall.  PORTABLE CHEST - 1 VIEW  Comparison: 01/31/2009  Findings: Shallow inspiration.  Normal heart size and pulmonary vascularity for technique.  No focal airspace consolidation in the lungs.  No blunting of costophrenic angles.  No pneumothorax.  No significant changes since the previous study.  IMPRESSION: No evidence of active disease.   Original Report Authenticated By: Marlon Pel, M.D.    Dg Hand Complete Left  08/19/2012  *RADIOLOGY REPORT*  Clinical Data: Pain and swelling.  Skin tear.  Post chills in the hand and wrist.  LEFT HAND - COMPLETE 3+ VIEW  Comparison: None.  Findings: Degenerative changes in the interphalangeal,  first metacarpal phalangeal, first carpometacarpal, and STT joints. Postoperative changes with plate screw fixation of the distal radial metaphysis and old ununited ulnar styloid process.  No acute fracture or subluxation.  No focal bone lesion or bone destruction. Diffuse soft tissue swelling over the carpal area.  No radiopaque soft tissue foreign bodies.  IMPRESSION: Degenerative changes in the hand and wrist.  Postoperative changes in the wrist.  Soft tissue swelling.  No acute bony abnormalities.   Original Report Authenticated By: Marlon Pel, M.D.     Microbiology: Recent Results (from the past 240 hour(s))  URINE CULTURE     Status: Normal   Collection Time   08/18/12 10:26 PM      Component Value Range Status Comment   Specimen Description URINE, RANDOM   Final    Special Requests ZO:XWRUE ON [id number] @2348    Final    Culture  Setup Time 08/19/2012 17:13   Final    Colony Count NO GROWTH   Final    Culture NO GROWTH   Final    Report Status 08/20/2012 FINAL   Final   CULTURE, BLOOD (ROUTINE X 2)     Status: Normal (Preliminary result)   Collection Time   08/18/12 11:30 PM      Component Value Range Status Comment   Specimen Description BLOOD  LEFT ARM   Final    Special Requests BOTTLES DRAWN AEROBIC ONLY 10CC   Final    Culture  Setup Time 08/19/2012 17:10   Final    Culture     Final    Value:        BLOOD CULTURE RECEIVED NO GROWTH TO DATE CULTURE WILL BE HELD FOR 5 DAYS BEFORE ISSUING A FINAL NEGATIVE REPORT   Report Status PENDING   Incomplete   CULTURE, BLOOD (ROUTINE X 2)     Status: Normal (Preliminary result)   Collection Time   08/18/12 11:36 PM      Component Value Range Status Comment   Specimen Description BLOOD RIGHT ARM   Final    Special Requests BOTTLES DRAWN AEROBIC ONLY 10CC   Final    Culture  Setup Time 08/19/2012 17:10   Final    Culture     Final    Value:        BLOOD CULTURE RECEIVED NO GROWTH TO DATE CULTURE WILL BE HELD FOR 5 DAYS BEFORE ISSUING A FINAL NEGATIVE REPORT   Report Status PENDING   Incomplete   WOUND CULTURE     Status: Normal   Collection Time   08/19/12  2:14 PM      Component Value Range Status Comment   Specimen Description WOUND LEFT FOREARM   Final    Special Requests NONE   Final    Gram Stain     Final    Value: FEW WBC PRESENT, PREDOMINANTLY PMN     NO SQUAMOUS EPITHELIAL CELLS SEEN     NO ORGANISMS SEEN   Culture MODERATE GROUP A STREP (S.PYOGENES) ISOLATED   Final    Report Status 08/21/2012 FINAL   Final      Labs: Basic Metabolic Panel:  Lab 08/24/12 1191 08/23/12 0605 08/22/12 0559 08/21/12 0618 08/20/12 1031  NA 139 137 139 140 136  K 3.6 4.1 3.7 3.3* 3.6  CL 105 106 107 108 104  CO2 24 22 22 20 22   GLUCOSE 79 172* 74 63* 243*  BUN 15 19 26* 36* 38*  CREATININE 1.12* 1.15* 1.45* 1.88*  2.16*  CALCIUM 9.2 8.8 8.5 8.0* 7.7*  MG -- -- -- -- --  PHOS -- -- -- -- --   Liver Function Tests:  Lab 08/18/12 2222  AST 41*  ALT 21  ALKPHOS 90  BILITOT 0.5  PROT 7.2  ALBUMIN 3.4*   No results found for this basename: LIPASE:5,AMYLASE:5 in the last 168 hours No results found for this basename: AMMONIA:5 in the last 168 hours CBC:  Lab 08/24/12 0630  08/23/12 0605 08/21/12 0618 08/19/12 0638 08/18/12 2338 08/18/12 2222  WBC 10.3 6.3 8.9 15.3* -- 18.2*  NEUTROABS -- -- -- -- 17.0* --  HGB 8.5* 8.6* 8.2* 9.8* -- 11.6*  HCT 25.5* 26.6* 24.8* 29.6* -- 34.5*  MCV 89.2 90.2 89.9 89.4 -- 90.3  PLT 247 199 146* 159 -- 191   Cardiac Enzymes:  Lab 08/19/12 1621 08/19/12 1023 08/19/12 0638 08/18/12 2223  CKTOTAL -- -- -- --  CKMB -- -- -- --  CKMBINDEX -- -- -- --  TROPONINI <0.30 <0.30 <0.30 <0.30   BNP: BNP (last 3 results) No results found for this basename: PROBNP:3 in the last 8760 hours CBG:  Lab 08/24/12 1207 08/24/12 0742 08/23/12 2319 08/23/12 2136 08/23/12 1621  GLUCAP 164* 79 132* 163* 242*    Time coordinating discharge: 45 minutes  Signed:  Birgitta Uhlir  [facility] 08/24/2012, 3:31 PM

## 2012-08-24 NOTE — Progress Notes (Signed)
PT Cancellation Note  Patient Details Name: Laysha Childers MRN: [id number] DOB: 02/29/1946   Cancelled Treatment:    Reason Eval/Treat Not Completed:  (pt declining mobility at this time.)   Sunny Schlein, [place] [phone] 08/24/2012, 8:16 AM

## 2012-08-25 LAB — CULTURE, BLOOD (ROUTINE X 2): Culture: NO GROWTH

## 2012-09-11 ENCOUNTER — Emergency Department (HOSPITAL_COMMUNITY): Payer: Medicare Other

## 2012-09-11 ENCOUNTER — Inpatient Hospital Stay (HOSPITAL_COMMUNITY)
Admission: EM | Admit: 2012-09-11 | Discharge: 2012-09-17 | DRG: 871 | Disposition: A | Discharge status: Home Health | Payer: Medicare Other | Attending: Internal Medicine | Admitting: Internal Medicine

## 2012-09-11 ENCOUNTER — Encounter (HOSPITAL_COMMUNITY): Payer: Self-pay | Admitting: Emergency Medicine

## 2012-09-11 DIAGNOSIS — I129 Hypertensive chronic kidney disease with stage 1 through stage 4 chronic kidney disease, or unspecified chronic kidney disease: Secondary | ICD-10-CM | POA: Diagnosis present

## 2012-09-11 DIAGNOSIS — R6520 Severe sepsis without septic shock: Secondary | ICD-10-CM | POA: Diagnosis present

## 2012-09-11 DIAGNOSIS — D649 Anemia, unspecified: Secondary | ICD-10-CM | POA: Diagnosis present

## 2012-09-11 DIAGNOSIS — R296 Repeated falls: Secondary | ICD-10-CM

## 2012-09-11 DIAGNOSIS — I248 Other forms of acute ischemic heart disease: Secondary | ICD-10-CM | POA: Diagnosis present

## 2012-09-11 DIAGNOSIS — E872 Acidosis, unspecified: Secondary | ICD-10-CM | POA: Diagnosis present

## 2012-09-11 DIAGNOSIS — Z91199 Patient's noncompliance with other medical treatment and regimen due to unspecified reason: Secondary | ICD-10-CM

## 2012-09-11 DIAGNOSIS — IMO0002 Reserved for concepts with insufficient information to code with codable children: Secondary | ICD-10-CM | POA: Diagnosis present

## 2012-09-11 DIAGNOSIS — I2489 Other forms of acute ischemic heart disease: Secondary | ICD-10-CM | POA: Diagnosis present

## 2012-09-11 DIAGNOSIS — Z794 Long term (current) use of insulin: Secondary | ICD-10-CM

## 2012-09-11 DIAGNOSIS — A419 Sepsis, unspecified organism: Principal | ICD-10-CM | POA: Diagnosis present

## 2012-09-11 DIAGNOSIS — K589 Irritable bowel syndrome without diarrhea: Secondary | ICD-10-CM | POA: Diagnosis present

## 2012-09-11 DIAGNOSIS — F39 Unspecified mood [affective] disorder: Secondary | ICD-10-CM | POA: Diagnosis present

## 2012-09-11 DIAGNOSIS — G4733 Obstructive sleep apnea (adult) (pediatric): Secondary | ICD-10-CM | POA: Diagnosis present

## 2012-09-11 DIAGNOSIS — Z79899 Other long term (current) drug therapy: Secondary | ICD-10-CM

## 2012-09-11 DIAGNOSIS — E860 Dehydration: Secondary | ICD-10-CM | POA: Diagnosis present

## 2012-09-11 DIAGNOSIS — Z96659 Presence of unspecified artificial knee joint: Secondary | ICD-10-CM

## 2012-09-11 DIAGNOSIS — N19 Unspecified kidney failure: Secondary | ICD-10-CM

## 2012-09-11 DIAGNOSIS — I10 Essential (primary) hypertension: Secondary | ICD-10-CM | POA: Diagnosis present

## 2012-09-11 DIAGNOSIS — E119 Type 2 diabetes mellitus without complications: Secondary | ICD-10-CM | POA: Diagnosis present

## 2012-09-11 DIAGNOSIS — N179 Acute kidney failure, unspecified: Secondary | ICD-10-CM | POA: Diagnosis present

## 2012-09-11 DIAGNOSIS — L03119 Cellulitis of unspecified part of limb: Secondary | ICD-10-CM

## 2012-09-11 DIAGNOSIS — E875 Hyperkalemia: Secondary | ICD-10-CM | POA: Diagnosis present

## 2012-09-11 DIAGNOSIS — N170 Acute kidney failure with tubular necrosis: Secondary | ICD-10-CM | POA: Diagnosis present

## 2012-09-11 DIAGNOSIS — I959 Hypotension, unspecified: Secondary | ICD-10-CM | POA: Diagnosis present

## 2012-09-11 DIAGNOSIS — K219 Gastro-esophageal reflux disease without esophagitis: Secondary | ICD-10-CM | POA: Diagnosis present

## 2012-09-11 DIAGNOSIS — Z9181 History of falling: Secondary | ICD-10-CM

## 2012-09-11 DIAGNOSIS — R4182 Altered mental status, unspecified: Secondary | ICD-10-CM

## 2012-09-11 DIAGNOSIS — Z9119 Patient's noncompliance with other medical treatment and regimen: Secondary | ICD-10-CM

## 2012-09-11 DIAGNOSIS — G9341 Metabolic encephalopathy: Secondary | ICD-10-CM | POA: Diagnosis present

## 2012-09-11 DIAGNOSIS — Z6841 Body Mass Index (BMI) 40.0 and over, adult: Secondary | ICD-10-CM

## 2012-09-11 DIAGNOSIS — N949 Unspecified condition associated with female genital organs and menstrual cycle: Secondary | ICD-10-CM

## 2012-09-11 DIAGNOSIS — N189 Chronic kidney disease, unspecified: Secondary | ICD-10-CM | POA: Diagnosis present

## 2012-09-11 DIAGNOSIS — R6521 Severe sepsis with septic shock: Secondary | ICD-10-CM

## 2012-09-11 LAB — CBC
Hemoglobin: 9.8 g/dL — ABNORMAL LOW (ref 12.0–15.0)
MCH: 29.9 pg (ref 26.0–34.0)
MCV: 91.5 fL (ref 78.0–100.0)
RBC: 3.28 MIL/uL — ABNORMAL LOW (ref 3.87–5.11)
WBC: 8.8 10*3/uL (ref 4.0–10.5)

## 2012-09-11 LAB — COMPREHENSIVE METABOLIC PANEL
ALT: 33 U/L (ref 0–35)
AST: 32 U/L (ref 0–37)
Alkaline Phosphatase: 89 U/L (ref 39–117)
CO2: 16 mEq/L — ABNORMAL LOW (ref 19–32)
Chloride: 99 mEq/L (ref 96–112)
GFR calc non Af Amer: 7 mL/min — ABNORMAL LOW (ref >90)
Sodium: 133 mEq/L — ABNORMAL LOW (ref 135–145)
Total Bilirubin: 0.1 mg/dL — ABNORMAL LOW (ref 0.3–1.2)

## 2012-09-11 MED ORDER — SODIUM CHLORIDE 0.9 % IV SOLN
1.0000 g | Freq: Once | INTRAVENOUS | Status: AC
  Administered 2012-09-12: 1 g via INTRAVENOUS
  Filled 2012-09-11: qty 10

## 2012-09-11 MED ORDER — VANCOMYCIN HCL IN DEXTROSE 1-5 GM/200ML-% IV SOLN
1000.0000 mg | Freq: Once | INTRAVENOUS | Status: AC
Start: 2012-09-11 — End: 2012-09-12
  Administered 2012-09-11: 1000 mg via INTRAVENOUS
  Filled 2012-09-11: qty 200

## 2012-09-11 MED ORDER — DEXTROSE 50 % IV SOLN
1.0000 | Freq: Once | INTRAVENOUS | Status: AC
  Administered 2012-09-11: 50 mL via INTRAVENOUS
  Filled 2012-09-11: qty 50

## 2012-09-11 MED ORDER — SODIUM POLYSTYRENE SULFONATE 15 GM/60ML PO SUSP
60.0000 g | Freq: Once | ORAL | Status: AC
  Administered 2012-09-11: 60 g via ORAL
  Filled 2012-09-11: qty 240

## 2012-09-11 MED ORDER — PIPERACILLIN-TAZOBACTAM 3.375 G IVPB
3.3750 g | Freq: Once | INTRAVENOUS | Status: AC
  Administered 2012-09-12: 3.375 g via INTRAVENOUS
  Filled 2012-09-11: qty 50

## 2012-09-11 MED ORDER — SODIUM CHLORIDE 0.9 % IV BOLUS (SEPSIS)
1000.0000 mL | Freq: Once | INTRAVENOUS | Status: AC
  Administered 2012-09-11: 1000 mL via INTRAVENOUS

## 2012-09-11 MED ORDER — CALCIUM GLUCONATE 10 % IV SOLN: 1.0000 g | Freq: Once | INTRAVENOUS | Status: DC

## 2012-09-11 MED ORDER — INSULIN ASPART 100 UNIT/ML ~~LOC~~ SOLN
10.0000 [IU] | Freq: Once | SUBCUTANEOUS | Status: AC
  Administered 2012-09-11: 10 [IU] via INTRAVENOUS
  Filled 2012-09-11: qty 1

## 2012-09-11 NOTE — ED Notes (Signed)
Patient transported to X-ray 

## 2012-09-11 NOTE — ED Notes (Signed)
Pt given OJ and crackers after passing swallowscreen

## 2012-09-11 NOTE — ED Notes (Signed)
Pt son's phone number: [phone]

## 2012-09-11 NOTE — ED Notes (Addendum)
Per nephew, pt was here about 2 weeks ago for having confusion and AMS; reports she went to assisted living for one week and now is back home by herself; family reports that she has been falling more frequently at home and having confusion, reports over weekend, pt having vomiting and drowsiness; pt alert at triage, shaking, pt will answer to person,place, time but disoriented to situation; face symmetrical, no drift, speech clear

## 2012-09-11 NOTE — ED Provider Notes (Signed)
History     CSN: [id number]  Arrival date & time 09/11/12  Angie Wang   First MD Initiated Contact with Patient 09/11/12 2129      Chief Complaint  Patient presents with  . Altered Mental Status    (Consider location/radiation/quality/duration/timing/severity/associated sxs/prior treatment) HPI A LEVEL 5 CAVEAT PERTAINS DUE TO URGENT NEED FOR INTERVENTION AND ALTERED MENTAL STATUS Pt presents confusioin and tremulousness.  Family states she was discharged several days ago from rehab facility and has become progressively more confused.  She has fallen multiple times.  Per chart review she was recently admitted and discharged for sepsis due to cellulitis of left arm- she is taking bactim and keflex by mouth for this and family states the redness is much improved.    Past Medical History  Diagnosis Date  . Diabetes mellitus without complication   . Hypertension   . IBS (irritable bowel syndrome)   . OSA (obstructive sleep apnea)   . Mood disorder   . Asthma   . Shortness of breath   . Chronic kidney disease     States she has kidney disease  . GERD (gastroesophageal reflux disease)     Past Surgical History  Procedure Date  . Total knee arthroplasty     bilateral  . Cholecystectomy   . Carpel tunnel   . Ovarian cyst removal     History reviewed. No pertinent family history.  History  Substance Use Topics  . Smoking status: Never Smoker   . Smokeless tobacco: Never Used  . Alcohol Use: No    OB History    Grav Para Term Preterm Abortions TAB SAB Ect Mult Living                  Review of Systems UNABLE TO OBTAIN ROS DUE LEVEL 5 CAVEAT  Allergies  Review of patient's allergies indicates no known allergies.  Home Medications   Current Outpatient Rx  Name  Route  Sig  Dispense  Refill  . ACETAMINOPHEN 500 MG PO TABS   Oral   Take 500 mg by mouth every 6 (six) hours as needed. For pain         . ALBUTEROL SULFATE HFA 108 (90 BASE) MCG/ACT IN AERS  Inhalation   Inhale 2 puffs into the lungs every 6 (six) hours as needed. For shortness of breath         . AMITRIPTYLINE HCL 25 MG PO TABS   Oral   Take 25 mg by mouth at bedtime.         . CELECOXIB 200 MG PO CAPS   Oral   Take 200 mg by mouth 2 (two) times daily.         . CEPHALEXIN 500 MG PO CAPS   Oral   Take 1 capsule (500 mg total) by mouth 4 (four) times daily.   32 capsule   0   . ESOMEPRAZOLE MAGNESIUM 40 MG PO CPDR   Oral   Take 40 mg by mouth daily before breakfast.         . FOLIC ACID-VIT B6-VIT B12 2.5-25-1 MG PO TABS   Oral   Take 1 tablet by mouth daily.         Marland Kitchen GABAPENTIN 600 MG PO TABS   Oral   Take 600 mg by mouth 2 (two) times daily.         Marland Kitchen GLIMEPIRIDE 4 MG PO TABS   Oral   Take 4 mg by mouth 2 (  two) times daily.         . INSULIN ASPART 100 UNIT/ML [place] SOLN   Subcutaneous   Inject 0-20 Units into the skin 3 (three) times daily with meals.   1 vial        CBG < 70: implement hypoglycemia protocol CBG 70  ...   . INSULIN DETEMIR 100 UNIT/ML [place] SOLN   Subcutaneous   Inject 50 Units into the skin at bedtime.   10 mL   0   . LIRAGLUTIDE 18 MG/3ML [place] SOLN   Subcutaneous   Inject 18 mg into the skin every morning.         Marland Kitchen LISINOPRIL-HYDROCHLOROTHIAZIDE 20-25 MG PO TABS   Oral   Take 1 tablet by mouth daily.         Marland Kitchen METFORMIN HCL 1000 MG PO TABS   Oral   Take 1,000 mg by mouth 2 (two) times daily with a meal.         . PAROXETINE HCL 40 MG PO TABS   Oral   Take 40 mg by mouth every morning.         . SULFAMETHOXAZOLE-TMP DS 800-160 MG PO TABS   Oral   Take 2 tablets by mouth every 12 (twelve) hours.         . TRAMADOL HCL 50 MG PO TABS   Oral   Take 50 mg by mouth every 8 (eight) hours as needed. For pain         . VITAMIN D (ERGOCALCIFEROL) 50000 UNITS PO CAPS   Oral   Take 50,000 Units by mouth every 7 (seven) days. On Friday           BP 70/49  Pulse 75  Temp 97.1 F (36.2 C)  (Rectal)  Resp 19  SpO2 96% Vitals reviewed Physical Exam Physical Examination: General appearance - alert, ill appearing, and in no distress Mental status - alert, oriented to person only Eyes - pupils equal and reactive, extraocular eye movements intact Mouth - mucous membranes moist, pharynx normal without lesions Chest - clear to auscultation, no wheezes, rales or rhonchi, symmetric air entry Heart - normal rate, regular rhythm, normal S1, S2, no murmurs, rubs, clicks or gallops Abdomen - soft, nontender, nondistended, no masses or organomegaly, nabs Neurological - alert, oriented x 1, clear speech but confused, strength 5/5 in extremities x 4, tremulousness, + asterixis Extremities - peripheral pulses normal, no pedal edema, no clubbing or cyanosis Skin - normal coloration and turgor, no rashes, multiple ecchymoses over extremities, some erythema of left dorsal surface of hand  ED Course  Procedures (including critical care time)   Date: 09/11/2012  Rate: 80  Rhythm: normal sinus rhythm  QRS Axis: left  Intervals: normal  ST/T Wave abnormalities: nonspecific ST/T changes  Conduction 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  Critical care was necessary to treat or prevent imminent or life-threatening deterioration.  Critical care was time spent personally by me on the following activities: development of treatment plan with patient and/or surrogate as well as nursing, discussions with consultants, evaluation of patient's response to treatment, examination of patient, obtaining history from patient or surrogate, ordering and performing treatments and interventions, ordering and review of laboratory studies,  ordering and  review of radiographic studies, pulse oximetry and re-evaluation of patient's condition.  11:57 PM  D/w Dr. Conley Rolls, triad for admission.  Will d/w Renal as well.  Pt to go to stepdown  12:13 AM  D/w Dr. Darrick Penna, renal - he does not advise emergent dialysis- will consult on patient in AM.     Labs Reviewed  GLUCOSE, CAPILLARY - Abnormal; Notable for the following:    Glucose-Capillary 66 (*)     All other components within normal limits  CBC - Abnormal; Notable for the following:    RBC 3.28 (*)     Hemoglobin 9.8 (*)     HCT 30.0 (*)     All other components within normal limits  COMPREHENSIVE METABOLIC PANEL - Abnormal; Notable for the following:    Sodium 133 (*)     Potassium 6.9 (*)     CO2 16 (*)     BUN 68 (*)     Creatinine, Ser 6.08 (*)     Total Bilirubin 0.1 (*)     GFR calc non Af Amer 7 (*)     GFR calc Af Amer 8 (*)     All other components within normal limits  LACTIC ACID, PLASMA - Abnormal; Notable for the following:    Lactic Acid, Venous 2.9 (*)     All other components within normal limits  POTASSIUM - Abnormal; Notable for the following:    Potassium 6.1 (*)     All other components within normal limits  GLUCOSE, CAPILLARY  URINALYSIS, ROUTINE W REFLEX MICROSCOPIC  CULTURE, BLOOD (ROUTINE X 2)  CULTURE, BLOOD (ROUTINE X 2)  URINE CULTURE   Dg Chest 2 View  09/11/2012  *RADIOLOGY REPORT*  Clinical Data: Altered mental status  CHEST - 2 VIEW  Comparison: 08/20/2012  Findings: Mild cardiac enlargement.  No pleural effusion or edema identified.  No airspace consolidation identified.  Lung volumes appear low.  IMPRESSION:  1.  No acute cardiopulmonary abnormalities.   Original Report Authenticated By: Signa Kell, M.D.    Ct Head Wo Contrast  09/11/2012  *RADIOLOGY REPORT*  Clinical Data: Altered mental status.  CT HEAD WITHOUT CONTRAST  Technique:  Contiguous axial images were obtained from the base of the skull through the vertex without  contrast.  Comparison: 08/18/2012  Findings: No acute intracranial abnormality.  Specifically, no hemorrhage, hydrocephalus, mass lesion, acute infarction, or significant intracranial injury.  No acute calvarial abnormality. Visualized paranasal sinuses and mastoids clear.  Orbital soft tissues unremarkable.  IMPRESSION: No acute intracranial abnormality.   Original Report Authenticated By: Charlett Nose, M.D.      1. Hyperkalemia   2. Renal failure   3. Altered mental status       MDM  Pt presenting with confusion and tremors over the past few days that is worsening.  Pt found to be in renal failure, hyperkalemic.  No peaked t waves on EKG, does have RBBB- but this has been present on prior EKGs per chart review.  No fluid overload on CXR.  Pt treated with insulin, glucose, kayexylate, calcium gluconate.  Head CT without acute abnormality- has fallen and hit her head over the past 2 days.          Ethelda Chick, MD 09/12/12 [phone]

## 2012-09-11 NOTE — ED Notes (Signed)
According to family, pt has been falling repeatedly at home since this past Friday; family reports one fall a day and three falls yesterday; pt lives by herself and has a life alert bracelet; pt is a diabetic and has recently had several episodes of hypoglycemia including today; CBG upon arrival 66, orange juice given to pt, CBG rechecked 74; pt has involuntary tremors and noted contusions consistent with her falls at home; pt a/o x3;

## 2012-09-12 ENCOUNTER — Inpatient Hospital Stay (HOSPITAL_COMMUNITY): Payer: Medicare Other

## 2012-09-12 DIAGNOSIS — M7989 Other specified soft tissue disorders: Secondary | ICD-10-CM

## 2012-09-12 DIAGNOSIS — D649 Anemia, unspecified: Secondary | ICD-10-CM | POA: Diagnosis present

## 2012-09-12 DIAGNOSIS — E875 Hyperkalemia: Secondary | ICD-10-CM | POA: Diagnosis present

## 2012-09-12 DIAGNOSIS — N949 Unspecified condition associated with female genital organs and menstrual cycle: Secondary | ICD-10-CM | POA: Diagnosis present

## 2012-09-12 DIAGNOSIS — N170 Acute kidney failure with tubular necrosis: Secondary | ICD-10-CM | POA: Diagnosis present

## 2012-09-12 DIAGNOSIS — I959 Hypotension, unspecified: Secondary | ICD-10-CM | POA: Diagnosis present

## 2012-09-12 DIAGNOSIS — E872 Acidosis: Secondary | ICD-10-CM | POA: Diagnosis present

## 2012-09-12 DIAGNOSIS — E860 Dehydration: Secondary | ICD-10-CM | POA: Diagnosis present

## 2012-09-12 LAB — URINALYSIS, ROUTINE W REFLEX MICROSCOPIC
Bilirubin Urine: NEGATIVE
Bilirubin Urine: NEGATIVE
Hgb urine dipstick: NEGATIVE
Ketones, ur: NEGATIVE mg/dL
Nitrite: NEGATIVE
Protein, ur: NEGATIVE mg/dL
Protein, ur: NEGATIVE mg/dL
Urobilinogen, UA: 0.2 mg/dL (ref 0.0–1.0)
pH: 5 (ref 5.0–8.0)

## 2012-09-12 LAB — RENAL FUNCTION PANEL
Albumin: 2.9 g/dL — ABNORMAL LOW (ref 3.5–5.2)
BUN: 65 mg/dL — ABNORMAL HIGH (ref 6–23)
BUN: 64 mg/dL — ABNORMAL HIGH (ref 6–23)
CO2: 17 mEq/L — ABNORMAL LOW (ref 19–32)
CO2: 16 mEq/L — ABNORMAL LOW (ref 19–32)
Calcium: 8.5 mg/dL (ref 8.4–10.5)
Calcium: 8.3 mg/dL — ABNORMAL LOW (ref 8.4–10.5)
Chloride: 109 mEq/L (ref 96–112)
Chloride: 105 mEq/L (ref 96–112)
Creatinine, Ser: 6.03 mg/dL — ABNORMAL HIGH (ref 0.50–1.10)
Creatinine, Ser: 5.46 mg/dL — ABNORMAL HIGH (ref 0.50–1.10)
GFR calc non Af Amer: 7 mL/min — ABNORMAL LOW (ref >90)
Glucose, Bld: 80 mg/dL (ref 70–99)

## 2012-09-12 LAB — BASIC METABOLIC PANEL
BUN: 61 mg/dL — ABNORMAL HIGH (ref 6–23)
CO2: 18 mEq/L — ABNORMAL LOW (ref 19–32)
Glucose, Bld: 140 mg/dL — ABNORMAL HIGH (ref 70–99)
Potassium: 5 mEq/L (ref 3.5–5.1)
Sodium: 137 mEq/L (ref 135–145)

## 2012-09-12 LAB — BLOOD GAS, ARTERIAL
Acid-base deficit: 10 mmol/L — ABNORMAL HIGH (ref 0.0–2.0)
Drawn by: 22251
FIO2: 0.21 %
pCO2 arterial: 39 mmHg (ref 35.0–45.0)
pH, Arterial: 7.237 — ABNORMAL LOW (ref 7.350–7.450)
pO2, Arterial: 91.7 mmHg (ref 80.0–100.0)

## 2012-09-12 LAB — CBC
Hemoglobin: 9.2 g/dL — ABNORMAL LOW (ref 12.0–15.0)
MCH: 30.1 pg (ref 26.0–34.0)
MCHC: 32.4 g/dL (ref 30.0–36.0)
Platelets: 203 10*3/uL (ref 150–400)
RDW: 15.7 % — ABNORMAL HIGH (ref 11.5–15.5)

## 2012-09-12 LAB — CREATININE, SERUM
Creatinine, Ser: 6.13 mg/dL — ABNORMAL HIGH (ref 0.50–1.10)
GFR calc non Af Amer: 7 mL/min — ABNORMAL LOW (ref >90)

## 2012-09-12 LAB — GLUCOSE, CAPILLARY
Glucose-Capillary: 147 mg/dL — ABNORMAL HIGH (ref 70–99)
Glucose-Capillary: 96 mg/dL (ref 70–99)
Glucose-Capillary: 154 mg/dL — ABNORMAL HIGH (ref 70–99)

## 2012-09-12 LAB — SODIUM, URINE, RANDOM: Sodium, Ur: 39 mEq/L

## 2012-09-12 LAB — POTASSIUM: Potassium: 6 mEq/L — ABNORMAL HIGH (ref 3.5–5.1)

## 2012-09-12 LAB — CK: Total CK: 139 U/L (ref 7–177)

## 2012-09-12 MED ORDER — LEVOFLOXACIN IN D5W 500 MG/100ML IV SOLN: 500.0000 mg | INTRAVENOUS | Status: DC

## 2012-09-12 MED ORDER — PAROXETINE HCL 20 MG PO TABS
40.0000 mg | ORAL_TABLET | Freq: Every day | ORAL | Status: DC
  Administered 2012-09-12 – 2012-09-17 (×6): 40 mg via ORAL
  Filled 2012-09-12 (×6): qty 2

## 2012-09-12 MED ORDER — ALBUTEROL SULFATE HFA 108 (90 BASE) MCG/ACT IN AERS
2.0000 | INHALATION_SPRAY | RESPIRATORY_TRACT | Status: DC | PRN
  Administered 2012-09-15: 2 via RESPIRATORY_TRACT
  Filled 2012-09-12: qty 6.7

## 2012-09-12 MED ORDER — INSULIN ASPART 100 UNIT/ML ~~LOC~~ SOLN
0.0000 [IU] | SUBCUTANEOUS | Status: DC
  Administered 2012-09-12: 1 [IU] via SUBCUTANEOUS
  Administered 2012-09-12: 2 [IU] via SUBCUTANEOUS
  Administered 2012-09-12: 3 [IU] via SUBCUTANEOUS
  Administered 2012-09-13 (×2): 9 [IU] via SUBCUTANEOUS
  Administered 2012-09-13: 7 [IU] via SUBCUTANEOUS
  Administered 2012-09-13: 2 [IU] via SUBCUTANEOUS
  Administered 2012-09-13: 3 [IU] via SUBCUTANEOUS
  Administered 2012-09-14 (×2): 5 [IU] via SUBCUTANEOUS
  Administered 2012-09-14 (×3): 2 [IU] via SUBCUTANEOUS
  Administered 2012-09-15 (×3): 3 [IU] via SUBCUTANEOUS

## 2012-09-12 MED ORDER — PANTOPRAZOLE SODIUM 40 MG PO TBEC
40.0000 mg | DELAYED_RELEASE_TABLET | Freq: Every day | ORAL | Status: DC
  Administered 2012-09-12 – 2012-09-17 (×6): 40 mg via ORAL
  Filled 2012-09-12 (×5): qty 1

## 2012-09-12 MED ORDER — PIPERACILLIN-TAZOBACTAM 3.375 G IVPB 30 MIN: 3.3750 g | Freq: Three times a day (TID) | INTRAVENOUS | Status: DC

## 2012-09-12 MED ORDER — SODIUM CHLORIDE 0.9 % IV BOLUS (SEPSIS)
500.0000 mL | Freq: Once | INTRAVENOUS | Status: AC
  Administered 2012-09-12: 500 mL via INTRAVENOUS

## 2012-09-12 MED ORDER — ACETAMINOPHEN 500 MG PO TABS: 500.0000 mg | ORAL_TABLET | Freq: Four times a day (QID) | ORAL | Status: DC | PRN

## 2012-09-12 MED ORDER — INSULIN ASPART 100 UNIT/ML ~~LOC~~ SOLN: 0.0000 [IU] | Freq: Three times a day (TID) | SUBCUTANEOUS | Status: DC

## 2012-09-12 MED ORDER — VITAMIN D (ERGOCALCIFEROL) 1.25 MG (50000 UNIT) PO CAPS
50000.0000 [IU] | ORAL_CAPSULE | ORAL | Status: DC
  Administered 2012-09-14: 50000 [IU] via ORAL
  Filled 2012-09-12 (×2): qty 1

## 2012-09-12 MED ORDER — VANCOMYCIN HCL 1000 MG IV SOLR
1250.0000 mg | Freq: Once | INTRAVENOUS | Status: AC
  Administered 2012-09-12: 1250 mg via INTRAVENOUS
  Filled 2012-09-12: qty 1250

## 2012-09-12 MED ORDER — HEPARIN SODIUM (PORCINE) 5000 UNIT/ML IJ SOLN
5000.0000 [IU] | Freq: Three times a day (TID) | INTRAMUSCULAR | Status: DC
  Administered 2012-09-12 – 2012-09-17 (×16): 5000 [IU] via SUBCUTANEOUS
  Filled 2012-09-12 (×19): qty 1

## 2012-09-12 MED ORDER — SODIUM CHLORIDE 0.9 % IV BOLUS (SEPSIS): 500.0000 mL | Freq: Once | INTRAVENOUS | Status: DC

## 2012-09-12 MED ORDER — INSULIN ASPART 100 UNIT/ML ~~LOC~~ SOLN
10.0000 [IU] | Freq: Once | SUBCUTANEOUS | Status: AC
  Administered 2012-09-12: 10 [IU] via INTRAVENOUS

## 2012-09-12 MED ORDER — PAROXETINE HCL 20 MG PO TABS: 40.0000 mg | ORAL_TABLET | ORAL | Status: DC

## 2012-09-12 MED ORDER — LEVOFLOXACIN IN D5W 750 MG/150ML IV SOLN
750.0000 mg | Freq: Once | INTRAVENOUS | Status: AC
  Administered 2012-09-12: 750 mg via INTRAVENOUS
  Filled 2012-09-12: qty 150

## 2012-09-12 MED ORDER — FA-PYRIDOXINE-CYANOCOBALAMIN 2.5-25-2 MG PO TABS
1.0000 | ORAL_TABLET | Freq: Every day | ORAL | Status: DC
  Administered 2012-09-12 – 2012-09-17 (×6): 1 via ORAL
  Filled 2012-09-12 (×6): qty 1

## 2012-09-12 MED ORDER — VANCOMYCIN HCL 1000 MG IV SOLR
1500.0000 mg | INTRAVENOUS | Status: DC
  Administered 2012-09-14: 1500 mg via INTRAVENOUS
  Filled 2012-09-12: qty 1500

## 2012-09-12 MED ORDER — DEXTROSE 50 % IV SOLN
INTRAVENOUS | Status: AC
  Filled 2012-09-12: qty 50

## 2012-09-12 MED ORDER — STERILE WATER FOR INJECTION IV SOLN
INTRAVENOUS | Status: DC
  Administered 2012-09-12 – 2012-09-14 (×3): via INTRAVENOUS
  Filled 2012-09-12 (×9): qty 850

## 2012-09-12 MED ORDER — ALBUTEROL SULFATE HFA 108 (90 BASE) MCG/ACT IN AERS
2.0000 | INHALATION_SPRAY | Freq: Four times a day (QID) | RESPIRATORY_TRACT | Status: DC | PRN
  Filled 2012-09-12: qty 6.7

## 2012-09-12 MED ORDER — LIRAGLUTIDE 18 MG/3ML ~~LOC~~ SOLN: 18.0000 mg | Freq: Every morning | SUBCUTANEOUS | Status: DC

## 2012-09-12 MED ORDER — SODIUM CHLORIDE 0.9 % IV SOLN
INTRAVENOUS | Status: DC
  Administered 2012-09-12: 03:00:00 via INTRAVENOUS

## 2012-09-12 MED ORDER — FOLIC ACID-VIT B6-VIT B12 2.5-25-1 MG PO TABS: 1.0000 | ORAL_TABLET | Freq: Every day | ORAL | Status: DC

## 2012-09-12 MED ORDER — VANCOMYCIN HCL 1000 MG IV SOLR: 1500.0000 mg | INTRAVENOUS | Status: DC

## 2012-09-12 MED ORDER — SODIUM CHLORIDE 0.9 % IV BOLUS (SEPSIS)
1000.0000 mL | Freq: Once | INTRAVENOUS | Status: AC
  Administered 2012-09-12: 1000 mL via INTRAVENOUS

## 2012-09-12 MED ORDER — ACETAMINOPHEN 325 MG PO TABS
650.0000 mg | ORAL_TABLET | Freq: Four times a day (QID) | ORAL | Status: DC | PRN
  Administered 2012-09-12 – 2012-09-16 (×5): 650 mg via ORAL
  Filled 2012-09-12 (×4): qty 2

## 2012-09-12 MED ORDER — INSULIN DETEMIR 100 UNIT/ML ~~LOC~~ SOLN
50.0000 [IU] | Freq: Every day | SUBCUTANEOUS | Status: DC
  Filled 2012-09-12: qty 10

## 2012-09-12 MED ORDER — INSULIN DETEMIR 100 UNIT/ML ~~LOC~~ SOLN
40.0000 [IU] | Freq: Every day | SUBCUTANEOUS | Status: DC
  Administered 2012-09-12 – 2012-09-15 (×4): 40 [IU] via SUBCUTANEOUS
  Filled 2012-09-12: qty 10

## 2012-09-12 MED ORDER — GABAPENTIN 600 MG PO TABS
600.0000 mg | ORAL_TABLET | Freq: Two times a day (BID) | ORAL | Status: DC
  Filled 2012-09-12 (×3): qty 1

## 2012-09-12 MED ORDER — SODIUM CHLORIDE 0.9 % IJ SOLN
3.0000 mL | Freq: Two times a day (BID) | INTRAMUSCULAR | Status: DC
  Administered 2012-09-12 – 2012-09-17 (×11): 3 mL via INTRAVENOUS

## 2012-09-12 MED ORDER — PIPERACILLIN-TAZOBACTAM IN DEX 2-0.25 GM/50ML IV SOLN
2.2500 g | Freq: Four times a day (QID) | INTRAVENOUS | Status: DC
Start: 2012-09-12 — End: 2012-09-12
  Filled 2012-09-12 (×3): qty 50

## 2012-09-12 MED ORDER — DEXTROSE 50 % IV SOLN
1.0000 | Freq: Once | INTRAVENOUS | Status: AC
  Administered 2012-09-12: 50 mL via INTRAVENOUS
  Filled 2012-09-12: qty 50

## 2012-09-12 NOTE — Progress Notes (Signed)
VASCULAR LAB PRELIMINARY  PRELIMINARY  PRELIMINARY  PRELIMINARY  Right upper extremity venous duplex completed.    Preliminary report:  Right:  No evidence of DVT or superficial thrombosis.    [name], RVT 09/12/2012, 10:18 AM

## 2012-09-12 NOTE — Consult Note (Signed)
Reason for Consult:ARF Referring Physician: [name] is an 66 y.o. female.  HPI: Angie Wang is a 66yo F with PMH sig for DM, HTN, OSA, and h/o AKI during her last hospitalization in October following TKA who was being treated for cellulitis with Bactrim DS and was brought to the ED with AMS.  Angie Wang was found to be hypotensive with SBP of 70 and lab values were significant for hyperkalemia and AKI with Scr of 6.08.  Her last known creatinine was 1.12 on 08/24/12 and the trend in creatinine is seen below.  Of note, she was also receiving Celebrex, lisinopril, HCTZ, and metformin PTA with concomitant bactrim therapy.  We were asked to help evaluate and manager her AKI and hyperkalemia  Trend in Creatinine: Creatinine, Ser  Date/Time Value Range Status  09/12/2012  2:55 AM 6.13* 0.50 - 1.10 mg/dL Final  16/08/9603  5:40 PM 6.08* 0.50 - 1.10 mg/dL Final  98/08/9146  8:29 AM 1.12* 0.50 - 1.10 mg/dL Final  56/21/3086  5:78 AM 1.15* 0.50 - 1.10 mg/dL Final  46/96/2952  8:41 AM 1.45* 0.50 - 1.10 mg/dL Final  32/44/0102  7:25 AM 1.88* 0.50 - 1.10 mg/dL Final  36/64/4034 74:25 AM 2.16* 0.50 - 1.10 mg/dL Final  95/63/8756  4:33 AM 2.31* 0.50 - 1.10 mg/dL Final  29/51/8841 66:06 PM 2.41* 0.50 - 1.10 mg/dL Final    PMH:   Past Medical History  Diagnosis Date  . Diabetes mellitus without complication   . Hypertension   . IBS (irritable bowel syndrome)   . OSA (obstructive sleep apnea)   . Mood disorder   . Asthma   . Shortness of breath   . Chronic kidney disease     States she has kidney disease  . GERD (gastroesophageal reflux disease)     PSH:   Past Surgical History  Procedure Date  . Total knee arthroplasty     bilateral  . Cholecystectomy   . Carpel tunnel   . Ovarian cyst removal     Allergies: No Known Allergies  Medications:   Prior to Admission medications   Medication Sig Start Date End Date Taking? Authorizing Provider  acetaminophen (TYLENOL) 500 MG tablet Take  500 mg by mouth every 6 (six) hours as needed. For pain 08/24/12  Yes Renae Fickle, MD  albuterol (PROVENTIL HFA;VENTOLIN HFA) 108 (90 BASE) MCG/ACT inhaler Inhale 2 puffs into the lungs every 6 (six) hours as needed. For shortness of breath   Yes Historical Provider, MD  amitriptyline (ELAVIL) 25 MG tablet Take 25 mg by mouth at bedtime.   Yes Historical Provider, MD  celecoxib (CELEBREX) 200 MG capsule Take 200 mg by mouth 2 (two) times daily.   Yes Historical Provider, MD  cephALEXin (KEFLEX) 500 MG capsule Take 1 capsule (500 mg total) by mouth 4 (four) times daily. 08/24/12  Yes Renae Fickle, MD  esomeprazole (NEXIUM) 40 MG capsule Take 40 mg by mouth daily before breakfast.   Yes Historical Provider, MD  Folic Acid-Vit B6-Vit B12 (FOLBEE) 2.5-25-1 MG TABS Take 1 tablet by mouth daily.   Yes Historical Provider, MD  gabapentin (NEURONTIN) 600 MG tablet Take 600 mg by mouth 2 (two) times daily.   Yes Historical Provider, MD  glimepiride (AMARYL) 4 MG tablet Take 4 mg by mouth 2 (two) times daily.   Yes Historical Provider, MD  insulin aspart (NOVOLOG) 100 UNIT/ML injection Inject 0-20 Units into the skin 3 (three) times daily with meals. 08/24/12  Yes Renae Fickle,  MD  insulin detemir (LEVEMIR) 100 UNIT/ML injection Inject 50 Units into the skin at bedtime. 08/24/12  Yes Renae Fickle, MD  Liraglutide (VICTOZA) 18 MG/3ML SOLN Inject 18 mg into the skin every morning.   Yes Historical Provider, MD  lisinopril-hydrochlorothiazide (PRINZIDE,ZESTORETIC) 20-25 MG per tablet Take 1 tablet by mouth daily.   Yes Historical Provider, MD  metFORMIN (GLUCOPHAGE) 1000 MG tablet Take 1,000 mg by mouth 2 (two) times daily with a meal.   Yes Historical Provider, MD  PARoxetine (PAXIL) 40 MG tablet Take 40 mg by mouth every morning.   Yes Historical Provider, MD  sulfamethoxazole-trimethoprim (BACTRIM DS) 800-160 MG per tablet Take 2 tablets by mouth every 12 (twelve) hours. 08/24/12  Yes Renae Fickle, MD  traMADol (ULTRAM) 50 MG tablet Take 50 mg by mouth every 8 (eight) hours as needed. For pain 08/24/12  Yes Renae Fickle, MD  Vitamin D, Ergocalciferol, (DRISDOL) 50000 UNITS CAPS Take 50,000 Units by mouth every 7 (seven) days. On Friday   Yes Historical Provider, MD    Discontinued Meds:   Medications Discontinued During This Encounter  Medication Reason  . exenatide (BYETTA) 5 MCG/0.02ML SOLN Discontinued by provider  . traMADol (ULTRAM) 50 MG tablet   . sulfamethoxazole-trimethoprim (BACTRIM DS) 800-160 MG per tablet   . acetaminophen (TYLENOL) 500 MG tablet   . calcium gluconate inj 10% (1 g) URGENT USE ONLY! Entry Error  . Folic Acid-Vit B6-Vit B12 (FOLBEE) tablet 1 tablet Formulary change  . PARoxetine (PAXIL) tablet 40 mg Formulary change  . piperacillin-tazobactam (ZOSYN) IVPB 3.375 g Dose change  . insulin aspart (novoLOG) injection 0-20 Units Duplicate  . gabapentin (NEURONTIN) tablet 600 mg   . piperacillin-tazobactam (ZOSYN) IVPB 2.25 g   . Liraglutide SOLN 18 mg   . sodium chloride 0.9 % bolus 500 mL   . vancomycin (VANCOCIN) 1,500 mg in sodium chloride 0.9 % 500 mL IVPB     Social History:  reports that she has never smoked. She has never used smokeless tobacco. She reports that she does not drink alcohol or use illicit drugs.  Family History:  History reviewed. No pertinent family history.  Pertinent items are noted in HPI. Weight change:   Intake/Output Summary (Last 24 hours) at 09/12/12 1046 Last data filed at 09/12/12 1000  Gross per 24 hour  Intake 1094.67 ml  Output    650 ml  Net 444.67 ml    General appearance: cooperative, mild distress and slowed mentation Head: Normocephalic, without obvious abnormality, atraumatic Eyes: negative findings: lids and lashes normal, conjunctivae and sclerae normal, corneas clear and pupils equal, round, reactive to light and accomodation Resp: clear to auscultation bilaterally Cardio: regular rate and  rhythm, S1, S2 normal, no murmur, click, rub or gallop GI: soft, non-tender; bowel sounds normal; no masses,  no organomegaly Extremities: edema of upper extremeties L>R and erythema with some ulcerations/excoriatins on arms as well as ecchymoses Neurologic: Alert and oriented X 3, normal strength and tone. Normal symmetric reflexes. Normal coordination and gait Coordination: some asterixis/myoclonic jerking and shakiness/tremors with upper extremities  Labs: Basic Metabolic Panel:  Lab 09/12/12 1610 09/12/12 0255 09/11/12 2258 09/11/12 1927  NA -- -- -- 133*  K 6.0* 6.3* 6.1* 6.9*  CL -- -- -- 99  CO2 -- -- -- 16*  GLUCOSE -- -- -- 77  BUN -- -- -- 68*  CREATININE -- 6.13* -- 6.08*  ALBUMIN -- -- -- 3.6  CALCIUM -- -- -- 9.3  PHOS -- -- -- --  Liver Function Tests:  Lab 09/11/12 1927  AST 32  ALT 33  ALKPHOS 89  BILITOT 0.1*  PROT 7.1  ALBUMIN 3.6   No results found for this basename: LIPASE:3,AMYLASE:3 in the last 168 hours No results found for this basename: AMMONIA:3 in the last 168 hours CBC:  Lab 09/12/12 0255 09/11/12 1927  WBC 7.8 8.8  NEUTROABS -- --  HGB 9.2* 9.8*  HCT 28.4* 30.0*  MCV 92.8 91.5  PLT 203 230   Angie Wang/INR: @labrcntip (inr:5) Cardiac Enzymes: No results found for this basename: CKTOTAL:5,CKMB:5,CKMBINDEX:5,TROPONINI:5 in the last 168 hours CBG:  Lab 09/12/12 0815 09/12/12 0302 09/11/12 2013 09/11/12 1914  GLUCAP 96 147* 74 66*    Iron Studies: No results found for this basename: IRON:30,TIBC:30,TRANSFERRIN:30,FERRITIN:30 in the last 168 hours  Xrays/Other Studies: Dg Chest 2 View  09/11/2012  *RADIOLOGY REPORT*  Clinical Data: Altered mental status  CHEST - 2 VIEW  Comparison: 08/20/2012  Findings: Mild cardiac enlargement.  No pleural effusion or edema identified.  No airspace consolidation identified.  Lung volumes appear low.  IMPRESSION:  1.  No acute cardiopulmonary abnormalities.   Original Report Authenticated By: Signa Kell,  M.D.    Ct Head Wo Contrast  09/11/2012  *RADIOLOGY REPORT*  Clinical Data: Altered mental status.  CT HEAD WITHOUT CONTRAST  Technique:  Contiguous axial images were obtained from the base of the skull through the vertex without contrast.  Comparison: 08/18/2012  Findings: No acute intracranial abnormality.  Specifically, no hemorrhage, hydrocephalus, mass lesion, acute infarction, or significant intracranial injury.  No acute calvarial abnormality. Visualized paranasal sinuses and mastoids clear.  Orbital soft tissues unremarkable.  IMPRESSION: No acute intracranial abnormality.   Original Report Authenticated By: Charlett Nose, M.D.      Assessment/Plan: 1.  AKI/CKD- Angie Wang with multiple renal insults including ischemic ATN in the setting of ACE-I, Cox-II Inhibitor, bactrim, metformin, and diuretic.  Also on the ddx would be obstruction from sulfa crystals, AIN due to bactrim, papillary necrosis due to COX-II, post-infectious GN, etc.  UOP is improving with IVF's as well as BP.  Also of note is that she had an episode of AKI during her last admission within a month's time with a similar presentation.  Her creatinine improved after stopping lisinopril and was resumed at time of discharge but no f/u creatinines are on record.  May need to evaluate renal arteries as well and would NOT resume ACE without outpt followup. 2. Metabolic acidosis- due to #1 and metformin therapy.  Would change IVF to isotonic bicarb and follow 3. Hyperkalemia- due to #1 and 2.  Start bicarb and follow 4. SIRS/hypotension- agree with volume resuscitation 5. OSA- cpap per primary svc 6. HTN- hold meds 7. Cellulitis- per primary svc.  Agree with stopping bactrim 8. AMS- some improvement this am 9. Anemia- will need further workup 10. DM- per primary svc 11. Dispo- per primary   Yvaine Jankowiak A 09/12/2012, 10:46 AM

## 2012-09-12 NOTE — ED Notes (Signed)
Pt assisted to bedside commode with assist from EMT;

## 2012-09-12 NOTE — Progress Notes (Signed)
ANTIBIOTIC CONSULT NOTE - INITIAL  Pharmacy Consult for Vancocin/Zosyn Indication: rule out sepsis and cellulitis  No Known Allergies  Patient Measurements: Height: 5\' 2"  (157.5 cm) Weight: 255 lb 1.2 oz (115.7 kg) IBW/kg (Calculated) : 50.1   Vital Signs: Temp: 97.1 F (36.2 C) (11/12 2309) Temp src: Rectal (11/12 2309) BP: 100/79 mmHg (11/13 0045) Pulse Rate: 75  (11/12 2131)  Labs:  [facility] 09/11/12 1927  WBC 8.8  HGB 9.8*  PLT 230  LABCREA --  CREATININE 6.08*   Estimated Creatinine Clearance: 11.3 ml/min (by C-G formula based on Cr of 6.08).  Microbiology: Recent Results (from the past 720 hour(s))  URINE CULTURE     Status: Normal   Collection Time   08/18/12 10:26 PM      Component Value Range Status Comment   Specimen Description URINE, RANDOM   Final    Special Requests UJ:[id number] ON [id number] @2348    Final    Culture  Setup Time 08/19/2012 17:13   Final    Colony Count NO GROWTH   Final    Culture NO GROWTH   Final    Report Status 08/20/2012 FINAL   Final   CULTURE, BLOOD (ROUTINE X 2)     Status: Normal   Collection Time   08/18/12 11:30 PM      Component Value Range Status Comment   Specimen Description BLOOD LEFT ARM   Final    Special Requests BOTTLES DRAWN AEROBIC ONLY 10CC   Final    Culture  Setup Time 08/19/2012 17:10   Final    Culture NO GROWTH 5 DAYS   Final    Report Status 08/25/2012 FINAL   Final   CULTURE, BLOOD (ROUTINE X 2)     Status: Normal   Collection Time   08/18/12 11:36 PM      Component Value Range Status Comment   Specimen Description BLOOD RIGHT ARM   Final    Special Requests BOTTLES DRAWN AEROBIC ONLY 10CC   Final    Culture  Setup Time 08/19/2012 17:10   Final    Culture NO GROWTH 5 DAYS   Final    Report Status 08/25/2012 FINAL   Final   WOUND CULTURE     Status: Normal   Collection Time   08/19/12  2:14 PM      Component Value Range Status Comment   Specimen Description WOUND LEFT FOREARM   Final    Special  Requests NONE   Final    Gram Stain     Final    Value: FEW WBC PRESENT, PREDOMINANTLY PMN     NO SQUAMOUS EPITHELIAL CELLS SEEN     NO ORGANISMS SEEN   Culture MODERATE GROUP A STREP (S.PYOGENES) ISOLATED   Final    Report Status 08/21/2012 FINAL   Final     Medical History: Past Medical History  Diagnosis Date  . Diabetes mellitus without complication   . Hypertension   . IBS (irritable bowel syndrome)   . OSA (obstructive sleep apnea)   . Mood disorder   . Asthma   . Shortness of breath   . Chronic kidney disease     States she has kidney disease  . GERD (gastroesophageal reflux disease)     Medications:  Prescriptions prior to admission  Medication Sig Dispense Refill  . acetaminophen (TYLENOL) 500 MG tablet Take 500 mg by mouth every 6 (six) hours as needed. For pain      . albuterol (PROVENTIL HFA;VENTOLIN  HFA) 108 (90 BASE) MCG/ACT inhaler Inhale 2 puffs into the lungs every 6 (six) hours as needed. For shortness of breath      . amitriptyline (ELAVIL) 25 MG tablet Take 25 mg by mouth at bedtime.      . celecoxib (CELEBREX) 200 MG capsule Take 200 mg by mouth 2 (two) times daily.      . cephALEXin (KEFLEX) 500 MG capsule Take 1 capsule (500 mg total) by mouth 4 (four) times daily.  32 capsule  0  . esomeprazole (NEXIUM) 40 MG capsule Take 40 mg by mouth daily before breakfast.      . Folic Acid-Vit B6-Vit B12 (FOLBEE) 2.5-25-1 MG TABS Take 1 tablet by mouth daily.      Marland Kitchen gabapentin (NEURONTIN) 600 MG tablet Take 600 mg by mouth 2 (two) times daily.      Marland Kitchen glimepiride (AMARYL) 4 MG tablet Take 4 mg by mouth 2 (two) times daily.      . insulin aspart (NOVOLOG) 100 UNIT/ML injection Inject 0-20 Units into the skin 3 (three) times daily with meals.  1 vial    . insulin detemir (LEVEMIR) 100 UNIT/ML injection Inject 50 Units into the skin at bedtime.  10 mL  0  . Liraglutide (VICTOZA) 18 MG/3ML SOLN Inject 18 mg into the skin every morning.      Marland Kitchen  lisinopril-hydrochlorothiazide (PRINZIDE,ZESTORETIC) 20-25 MG per tablet Take 1 tablet by mouth daily.      . metFORMIN (GLUCOPHAGE) 1000 MG tablet Take 1,000 mg by mouth 2 (two) times daily with a meal.      . PARoxetine (PAXIL) 40 MG tablet Take 40 mg by mouth every morning.      . sulfamethoxazole-trimethoprim (BACTRIM DS) 800-160 MG per tablet Take 2 tablets by mouth every 12 (twelve) hours.      . traMADol (ULTRAM) 50 MG tablet Take 50 mg by mouth every 8 (eight) hours as needed. For pain      . Vitamin D, Ergocalciferol, (DRISDOL) 50000 UNITS CAPS Take 50,000 Units by mouth every 7 (seven) days. On Friday       Scheduled:    . calcium gluconate  1 g Intravenous Once  . [COMPLETED] dextrose  1 ampule Intravenous Once  . folic acid-pyridoxine-cyancobalamin  1 tablet Oral Daily  . gabapentin  600 mg Oral BID  . heparin  5,000 Units Subcutaneous Q8H  . insulin aspart  0-9 Units Subcutaneous Q4H  . [COMPLETED] insulin aspart  10 Units Intravenous Once  . insulin detemir  50 Units Subcutaneous QHS  . Liraglutide  18 mg Subcutaneous q morning - 10a  . pantoprazole  40 mg Oral Daily  . PARoxetine  40 mg Oral Daily  . piperacillin-tazobactam (ZOSYN)  IV  2.25 g Intravenous Q6H  . [COMPLETED] piperacillin-tazobactam (ZOSYN)  IV  3.375 g Intravenous Once  . [COMPLETED] sodium chloride  1,000 mL Intravenous Once  . [COMPLETED] sodium chloride  1,000 mL Intravenous Once  . sodium chloride  3 mL Intravenous Q12H  . [COMPLETED] sodium polystyrene  60 g Oral Once  . vancomycin  1,250 mg Intravenous Once  . vancomycin  1,500 mg Intravenous Q48H  . [COMPLETED] vancomycin  1,000 mg Intravenous Once  . Vitamin D (Ergocalciferol)  50,000 Units Oral Q7 days  . [DISCONTINUED] calcium gluconate  1 g Intravenous Once  . [DISCONTINUED] Folic Acid-Vit B6-Vit B12  1 tablet Oral Daily  . [DISCONTINUED] insulin aspart  0-20 Units Subcutaneous TID WC  . [DISCONTINUED] PARoxetine  40 mg Oral BH-q7a  .  [DISCONTINUED] piperacillin-tazobactam  3.375 g Intravenous Q8H   Infusions:    . sodium chloride      Assessment: 66yo female brought to ED by family for AMS, has been tx'd for cellulitis with Bactrim, now with ARF (baseline SCr ~1.1 <19mo ago), possibly uremic with early sepsis, to begin IV ABX.  Goal of Therapy:  Vancomycin trough level 15-20 mcg/ml (will lower if sepsis r/o and only need to tx cellulitis)  Plan:  Rec'd vanc 1g in ED; will give additional vancomycin 1250mg  IV for total load of 2250mg  then begin vancomycin 1500mg  IV Q48H and monitor CBC, Cx, SCr, and levels prn.  Will also change Zosyn to 2.25g IV Q6H given ARF and monitor for dose change.  Colleen Can PharmD BCPS 09/12/2012,2:28 AM

## 2012-09-12 NOTE — Progress Notes (Signed)
UR completed 

## 2012-09-12 NOTE — ED Notes (Signed)
Pt assisted to beside commode with assist from RN;

## 2012-09-12 NOTE — Progress Notes (Signed)
CRITICAL VALUE ALERT  Critical value received:  K+ 6.3   Date of notification: 09/12/12  Time of notification:  0400 Critical value read back:yes  Nurse who received alert:  Janett Labella   MD notified (1st page):  Dr. Conley Rolls  Time of first page:  0400 MD notified (2nd page):  Time of second page:  Responding MD:  Dr. Conley Rolls    Time MD responded: [phone]

## 2012-09-12 NOTE — Progress Notes (Signed)
[facility] Progress Note [place] TEAM 1 - Stepdown/ICU TEAM   Landis Gandy NGE:[id number] DOB: 12-05-1945 DOA: 09/11/2012 PCP: Gabriel Cirri, DO  Brief narrative: 66 year old female patient with multiple medical problems who lives at home alone. She has a nephew and a brother who check on her frequently. She was brought to the emergency department because of increasing confusion. In addition she has cellulitis of the left upper extremity and has been on Bactrim. Evaluation in the ER demonstrated acute renal failure with a creatinine of 6 and a BUN of 68 as well as hyperkalemia. She was empirically treated with calcium, D50 and insulin as well as Kayexalate for hyperkalemia. She was profoundly lethargic and confused on admission and it was felt this is related to uremia so nephrology was consulted. Chest x-ray revealed no evidence of infiltrate. Her white count was normal. Her hemoglobin is 9.8. She was subsequently admitted to the step down unit for further monitoring and treatment .Patient was recently hospitalized in mid October for the cellulitis of the upper extremity. She was also treated for acute renal failure and during the hospitalization her Lasix and ACE inhibitor were held, the ACE inhibitor was resumed at discharge.  Assessment/Plan:  Acute renal failure/ ATN (acute tubular necrosis) *Baseline BUN 15 and creatinine 1.12 on 08/24/2012 *Appreciate nephrology assistance *Agree that patient likely has a multifactorial etiology to her renal failure consisting of hypotension/low perfusion mediated, medication related secondary to Celebrex, lisinopril, hydrochlorothiazide, metformin and Bactrim she was taking at home. *We'll continue aggressive rehydration and agree with changing maintenance fluids to isotonic bicarbonate *Agree with recommendation to NOT resume ACE inhibitor as an outpatient unless can be followed very closely *Renal ultrasound showed no  hydronephrosis  Hyperkalemia *Secondary to acute renal failure improving with hydration and previous medical therapy *EKG remained stable and potassium is less than 6 so we'll not give additional Kayexalate at this time *Repeat electrolyte panel 6pm  Hypotension/Dehydration *Continue IV fluids and monitor urine output *Was adequately treated with Bactrim prior to admission so do not feel sepsis is etiology to hypotension   Metabolic acidosis *Primarily mediated by acute renal failure with concomitant acidosis related to low perfusion and hypotension *Nephrology initiating bicarbonate infusion  Metabolic encephalopathy *Likely related to uremia from acute renal failure *CT head performed in the emergency department showed no acute intracranial abnormality *TSH during last admission was normal  DM II (diabetes mellitus, type II), controlled *Currently controlled-check hemoglobin A1c *Was on metformin prior to admission and this is currently being held-may need to reevaluate if she is appropriate for this medication at time of discharge *Continue sliding scale insulin *Continue hour sleep Levemir-watch closely while n.p.o. dosage may need to be decreased although the addition of dextrose solution with bicarbonate infusion should help keep CBGs appropriate  HTN (hypertension) *Currently with hypotension *All antihypertensive medications remain on hold *See above regarding ACE inhibitor after discharge  Cellulitis of upper extremity *Doppler of left arm this admission shows no evidence of DVT *Continue empiric vancomycin + levaquin  OSA (obstructive sleep apnea) *ABG this admission does not demonstrate any hypercarbia  Anemia *Baseline hemoglobin around 8.5  Right Adnexal cyst *Seen on renal US- needs additional such as pelvic US  DVT prophylaxis: Continue heparin subcutaneous every 8 hours Code Status: Full Family Communication: No family at bedside Disposition Plan: Remain  in ICU  Consultants: Nephrology  Procedures: None  Antibiotics: Zosyn 11/12 >>>11/13 Vancomycin 11/12 >>> Levaquin 11/13>>  HPI/Subjective: Patient is lethargic and not arousable, snoring  loudly.   Objective: Blood pressure 88/57, pulse 74, temperature 97.4 F (36.3 C), temperature source Axillary, resp. rate 14, height 5\' 2"  (1.575 m), weight 115.7 kg (255 lb 1.2 oz), SpO2 98.00%.  Intake/Output Summary (Last 24 hours) at 09/12/12 1839 Last data filed at 09/12/12 1800  Gross per 24 hour  Intake   2508 ml  Output    930 ml  Net   1578 ml     Exam: F/U exam completed  Data Reviewed: Basic Metabolic Panel:  Lab 09/12/12 1610 09/12/12 0938 09/12/12 0515 09/12/12 0255 09/11/12 2258 09/11/12 1927  NA 136 140 -- -- -- 133*  K 5.2* 5.8* 6.0* 6.3* 6.1* --  CL 105 109 -- -- -- 99  CO2 16* 17* -- -- -- 16*  GLUCOSE 171* 80 -- -- -- 77  BUN 64* 65* -- -- -- 68*  CREATININE 5.46* 6.03* -- 6.13* -- 6.08*  CALCIUM 8.3* 8.5 -- -- -- 9.3  MG -- -- -- -- -- --  PHOS 5.7* 6.0* -- -- -- --   Liver Function Tests:  Lab 09/12/12 1647 09/12/12 0938 09/11/12 1927  AST -- -- 32  ALT -- -- 33  ALKPHOS -- -- 89  BILITOT -- -- 0.1*  PROT -- -- 7.1  ALBUMIN 2.9* 2.9* 3.6   CBC:  Lab 09/12/12 0255 09/11/12 1927  WBC 7.8 8.8  NEUTROABS -- --  HGB 9.2* 9.8*  HCT 28.4* 30.0*  MCV 92.8 91.5  PLT 203 230   Cardiac Enzymes:  Lab 09/12/12 0938  CKTOTAL 139  CKMB --  CKMBINDEX --  TROPONINI --   CBG:  Lab 09/12/12 1607 09/12/12 1221 09/12/12 0815 09/12/12 0302 09/11/12 2013  GLUCAP 154* 77 96 147* 74    Recent Results (from the past 240 hour(s))  MRSA PCR SCREENING     Status: Normal   Collection Time   09/12/12  2:23 AM      Component Value Range Status Comment   MRSA by PCR NEGATIVE  NEGATIVE Final      Studies:  Recent x-ray studies have been reviewed in detail by the Attending Physician  Scheduled Meds:  Reviewed in detail by the Attending  Physician   Junious Silk, ANP [facility] Office  [phone] Pager [phone]  On-Call/Text Page:      [email or web address]      password [email or web address]  If 7PM-7AM, please contact night-coverage [email or web address] Password [email or web address] 09/12/2012, 6:39 PM   LOS: 1 day   I have personally examined this patient and reviewed the entire database. I have reviewed the above note, made any necessary editorial changes, and agree with its content.  Lonia Blood, MD [facility]

## 2012-09-12 NOTE — H&P (Signed)
[facility] History and Physical  Angie Wang ZOX:[id number] DOB: 1946/02/06    PCP:   Gabriel Cirri, DO   Chief Complaint: Found to have K of 6.9 and in ARF.  HPI: Angie Wang is an 66 y.o. female with hx of DM, HTN, IBS, OSA living at home alone with help from nephew and brother, brought to the ER because of increase confusion.  She was having cellulitis of the upper extremity and has been on Bactrim.  Evaluation in the ER showed ARF with Cr of 6 and BUN of 68.  Her baseline Cr was normal about one month ago.  She was found to have severe hyperkalemia with K of 6.9 and no EKG changes. She was tx with Calcium, D50 and Insulin, and K exalate.  She was thought to be uremic, and nephrology was consulted with Dr Darrick Penna.  Hospitalist was asked to admit her for further evaluation and Tx. Other work up included a CXR showing no infiltrate, a normal WBC and Hb of 9.8 g/DL.    Rewiew of Systems:  Constitutional: Negative for malaise, fever and chills. No significant weight loss or weight gain Eyes: Negative for eye pain, redness and discharge, diplopia, visual changes, or flashes of light. ENMT: Negative for ear pain, hoarseness, nasal congestion, sinus pressure and sore throat. No headaches; tinnitus, drooling, or problem swallowing. Cardiovascular: Negative for chest pain, palpitations, diaphoresis, dyspnea and peripheral edema. ; No orthopnea, PND Respiratory: Negative for cough, hemoptysis, wheezing and stridor. No pleuritic chestpain. Gastrointestinal: Negative for nausea, vomiting, diarrhea, constipation, abdominal pain, melena, blood in stool, hematemesis, jaundice and rectal bleeding.    Genitourinary: Negative for frequency, dysuria, incontinence,flank pain and hematuria; Musculoskeletal: Negative for back pain and neck pain. Negative for swelling and trauma.;  Skin: . Negative for pruritus, rash, abrasions, bruising and skin lesion.; ulcerations Neuro: Negative for headache,  lightheadedness and neck stiffneness, extremity weakness, burning feet, involuntary movement, seizure and syncope.  Psych: negative for anxiety, depression, insomnia, tearfulness, panic attacks, hallucinations, paranoia, suicidal or homicidal ideation    Past Medical History  Diagnosis Date  . Diabetes mellitus without complication   . Hypertension   . IBS (irritable bowel syndrome)   . OSA (obstructive sleep apnea)   . Mood disorder   . Asthma   . Shortness of breath   . Chronic kidney disease     States she has kidney disease  . GERD (gastroesophageal reflux disease)     Past Surgical History  Procedure Date  . Total knee arthroplasty     bilateral  . Cholecystectomy   . Carpel tunnel   . Ovarian cyst removal     Medications:  HOME MEDS: Prior to Admission medications   Medication Sig Start Date End Date Taking? Authorizing Provider  acetaminophen (TYLENOL) 500 MG tablet Take 500 mg by mouth every 6 (six) hours as needed. For pain 08/24/12  Yes Renae Fickle, MD  albuterol (PROVENTIL HFA;VENTOLIN HFA) 108 (90 BASE) MCG/ACT inhaler Inhale 2 puffs into the lungs every 6 (six) hours as needed. For shortness of breath   Yes Historical Provider, MD  amitriptyline (ELAVIL) 25 MG tablet Take 25 mg by mouth at bedtime.   Yes Historical Provider, MD  celecoxib (CELEBREX) 200 MG capsule Take 200 mg by mouth 2 (two) times daily.   Yes Historical Provider, MD  cephALEXin (KEFLEX) 500 MG capsule Take 1 capsule (500 mg total) by mouth 4 (four) times daily. 08/24/12  Yes Renae Fickle, MD  esomeprazole (NEXIUM) 40 MG capsule  Take 40 mg by mouth daily before breakfast.   Yes Historical Provider, MD  Folic Acid-Vit B6-Vit B12 (FOLBEE) 2.5-25-1 MG TABS Take 1 tablet by mouth daily.   Yes Historical Provider, MD  gabapentin (NEURONTIN) 600 MG tablet Take 600 mg by mouth 2 (two) times daily.   Yes Historical Provider, MD  glimepiride (AMARYL) 4 MG tablet Take 4 mg by mouth 2 (two) times  daily.   Yes Historical Provider, MD  insulin aspart (NOVOLOG) 100 UNIT/ML injection Inject 0-20 Units into the skin 3 (three) times daily with meals. 08/24/12  Yes Renae Fickle, MD  insulin detemir (LEVEMIR) 100 UNIT/ML injection Inject 50 Units into the skin at bedtime. 08/24/12  Yes Renae Fickle, MD  Liraglutide (VICTOZA) 18 MG/3ML SOLN Inject 18 mg into the skin every morning.   Yes Historical Provider, MD  lisinopril-hydrochlorothiazide (PRINZIDE,ZESTORETIC) 20-25 MG per tablet Take 1 tablet by mouth daily.   Yes Historical Provider, MD  metFORMIN (GLUCOPHAGE) 1000 MG tablet Take 1,000 mg by mouth 2 (two) times daily with a meal.   Yes Historical Provider, MD  PARoxetine (PAXIL) 40 MG tablet Take 40 mg by mouth every morning.   Yes Historical Provider, MD  sulfamethoxazole-trimethoprim (BACTRIM DS) 800-160 MG per tablet Take 2 tablets by mouth every 12 (twelve) hours. 08/24/12  Yes Renae Fickle, MD  traMADol (ULTRAM) 50 MG tablet Take 50 mg by mouth every 8 (eight) hours as needed. For pain 08/24/12  Yes Renae Fickle, MD  Vitamin D, Ergocalciferol, (DRISDOL) 50000 UNITS CAPS Take 50,000 Units by mouth every 7 (seven) days. On Friday   Yes Historical Provider, MD     Allergies:  No Known Allergies  Social History:   reports that she has never smoked. She has never used smokeless tobacco. She reports that she does not drink alcohol or use illicit drugs.  Family History: History reviewed. No pertinent family history.   Physical Exam: Filed Vitals:   09/11/12 2129 09/11/12 2131 09/11/12 2309 09/11/12 2345  BP: 77/24 96/83  70/49  Pulse: 76 75    Temp:   97.1 F (36.2 C)   TempSrc:   Rectal   Resp: 16 16  19   SpO2:       Blood pressure 70/49, pulse 75, temperature 97.1 F (36.2 C), temperature source Rectal, resp. rate 19, SpO2 96.00%.  GEN:  Pleasant  patient lying in the stretcher in no acute distress; cooperative with exam. PSYCH:  alert and oriented x4; does not  appear anxious or depressed; affect is appropriate. HEENT: Mucous membranes pink and anicteric; PERRLA; EOM intact; no cervical lymphadenopathy nor thyromegaly or carotid bruit; no JVD; There were no stridor. Neck is very supple. Breasts:: Not examined CHEST WALL: No tenderness CHEST: Normal respiration, clear to auscultation bilaterally.  HEART: Regular rate and rhythm.  There are no murmur, rub, or gallops.   BACK: No kyphosis or scoliosis; no CVA tenderness ABDOMEN: soft and non-tender; no masses, no organomegaly, normal abdominal bowel sounds; no pannus; no intertriginous candida. There is no rebound and no distention. Rectal Exam: Not done EXTREMITIES: No bone or joint deformity; age-appropriate arthropathy of the hands and knees; no edema; no ulcerations.  There is no calf tenderness. There is cellulitis of her left upper extremity. Genitalia: not examined PULSES: 2+ and symmetric SKIN: Normal hydration no rash or ulceration CNS: Cranial nerves 2-12 grossly intact no focal lateralizing neurologic deficit.  Speech is fluent; uvula elevated with phonation, facial symmetry and tongue midline. DTR are normal bilaterally,  cerebella exam is intact, barbinski is negative and strengths are equaled bilaterally.  No sensory loss.  She has asterixis.   Labs on Admission:  Basic Metabolic Panel:  Lab 09/11/12 2956 09/11/12 1927  NA -- 133*  K 6.1* 6.9*  CL -- 99  CO2 -- 16*  GLUCOSE -- 77  BUN -- 68*  CREATININE -- 6.08*  CALCIUM -- 9.3  MG -- --  PHOS -- --   Liver Function Tests:  Lab 09/11/12 1927  AST 32  ALT 33  ALKPHOS 89  BILITOT 0.1*  PROT 7.1  ALBUMIN 3.6   No results found for this basename: LIPASE:5,AMYLASE:5 in the last 168 hours No results found for this basename: AMMONIA:5 in the last 168 hours CBC:  Lab 09/11/12 1927  WBC 8.8  NEUTROABS --  HGB 9.8*  HCT 30.0*  MCV 91.5  PLT 230   Cardiac Enzymes: No results found for this basename:  CKTOTAL:5,CKMB:5,CKMBINDEX:5,TROPONINI:5 in the last 168 hours  CBG:  Lab 09/11/12 2013 09/11/12 1914  GLUCAP 74 66*     Radiological Exams on Admission: Dg Chest 2 View  09/11/2012  *RADIOLOGY REPORT*  Clinical Data: Altered mental status  CHEST - 2 VIEW  Comparison: 08/20/2012  Findings: Mild cardiac enlargement.  No pleural effusion or edema identified.  No airspace consolidation identified.  Lung volumes appear low.  IMPRESSION:  1.  No acute cardiopulmonary abnormalities.   Original Report Authenticated By: Signa Kell, M.D.    Ct Head Wo Contrast  09/11/2012  *RADIOLOGY REPORT*  Clinical Data: Altered mental status.  CT HEAD WITHOUT CONTRAST  Technique:  Contiguous axial images were obtained from the base of the skull through the vertex without contrast.  Comparison: 08/18/2012  Findings: No acute intracranial abnormality.  Specifically, no hemorrhage, hydrocephalus, mass lesion, acute infarction, or significant intracranial injury.  No acute calvarial abnormality. Visualized paranasal sinuses and mastoids clear.  Orbital soft tissues unremarkable.  IMPRESSION: No acute intracranial abnormality.   Original Report Authenticated By: Charlett Nose, M.D.     EKG: Independently reviewed. SR, no peaked t waves.   Assessment/Plan Present on Admission:  . Acute kidney injury . Hyperkalemia . Toxic encephalopathy . OSA (obstructive sleep apnea) . DM (diabetes mellitus) . Cellulitis of upper extremity . HTN (hypertension)  PLAN:  Patient is admitted for acute renal failure with hyperkalemia and no EKG changes.  I have asked EDP to consult nephrology.  In the interim, will tx with Calcium, Insulin and D50, along with Kayexalate.  Suspect she is prerenal and has been on ACE_I, Diuretics, along with nephrotoxic agent such as sulfamethoxazole.  Will stop these, and give IVF.  I will obtain a renal US as well.  She may have early sepsis, and pan cultures were done, along with starting on Van/  Zosyn.  She is slightly uremic as well and nephrology has been consulted.  She will be admitted to the SDU under [facility] service.  She is a full code. Other plans as per orders.  Code Status: FULL Unk Lightning, MD. [facility] Pager [phone] 7pm to 7am.  09/12/2012, 1:19 AM

## 2012-09-12 NOTE — Progress Notes (Signed)
Received call from nursing staff. Patient systolic blood pressure in the 60s. Baseline BP during last admit around 120-130's. Patient reported as somewhat lethargic,. Admission H&P and current labs reviewed. The patient presented with acute renal failure with a BUN of 68 and creatinine of 6.08 and potassium of 6.9. Creatinine at discharge 08/24/2012 was 1.12 with a BUN of 15. During last admission patient was admitted with acute renal failure felt to be related to a combination of medications, dehydration and sepsis. During last admission Lasix and ACE inhibitor were held - the ACE inhibitor was resumed at time of discharge. RN has given one 500 cc normal saline bolus. I ordered an additional 500 cc bolus. I reviewed patient's previous echocardiogram from this year which demonstrated preserved LV function and grade 1 diastolic dysfunction. At this time it is believed the patient's hypotension is primarily related to volume depletion therefore I have also increased her IV fluids to 150 cc per hour. Review of the ER record reveals the patient received 2 L of IV fluid in the emergency department.  Junious Silk, ANP [phone]

## 2012-09-12 NOTE — Progress Notes (Signed)
ANTIBIOTIC CONSULT NOTE - INITIAL  Pharmacy Consult:  Vancomycin / Levaquin Indication:  Arm cellulitis  No Known Allergies  Patient Measurements: Height: 5\' 2"  (157.5 cm) Weight: 255 lb 1.2 oz (115.7 kg) IBW/kg (Calculated) : 50.1   Vital Signs: Temp: 97.4 F (36.3 C) (11/13 1600) Temp src: Axillary (11/13 1600) BP: 88/57 mmHg (11/13 1800) Pulse Rate: 74  (11/13 1800) Intake/Output from previous day: 11/12 0701 - 11/13 0700 In: 891.7 [I.V.:391.7; IV Piggyback:500] Out: -  Intake/Output from this shift: Total I/O In: 1616.3 [P.O.:260; I.V.:1356.3] Out: 960 [Urine:960]  Labs:  [facility] 09/12/12 1647 09/12/12 0938 09/12/12 0924 09/12/12 0255 09/11/12 1927  WBC -- -- -- 7.8 8.8  HGB -- -- -- 9.2* 9.8*  PLT -- -- -- 203 230  LABCREA -- -- 157.48 -- --  CREATININE 5.46* 6.03* -- 6.13* --   Estimated Creatinine Clearance: 12.5 ml/min (by C-G formula based on Cr of 5.46). No results found for this basename: VANCOTROUGH:2,VANCOPEAK:2,VANCORANDOM:2,GENTTROUGH:2,GENTPEAK:2,GENTRANDOM:2,TOBRATROUGH:2,TOBRAPEAK:2,TOBRARND:2,AMIKACINPEAK:2,AMIKACINTROU:2,AMIKACIN:2, in the last 72 hours   Microbiology: Recent Results (from the past 720 hour(s))  URINE CULTURE     Status: Normal   Collection Time   08/18/12 10:26 PM      Component Value Range Status Comment   Specimen Description URINE, RANDOM   Final    Special Requests EA:VWUJW ON [id number] @2348    Final    Culture  Setup Time 08/19/2012 17:13   Final    Colony Count NO GROWTH   Final    Culture NO GROWTH   Final    Report Status 08/20/2012 FINAL   Final   CULTURE, BLOOD (ROUTINE X 2)     Status: Normal   Collection Time   08/18/12 11:30 PM      Component Value Range Status Comment   Specimen Description BLOOD LEFT ARM   Final    Special Requests BOTTLES DRAWN AEROBIC ONLY 10CC   Final    Culture  Setup Time 08/19/2012 17:10   Final    Culture NO GROWTH 5 DAYS   Final    Report Status 08/25/2012 FINAL   Final   CULTURE,  BLOOD (ROUTINE X 2)     Status: Normal   Collection Time   08/18/12 11:36 PM      Component Value Range Status Comment   Specimen Description BLOOD RIGHT ARM   Final    Special Requests BOTTLES DRAWN AEROBIC ONLY 10CC   Final    Culture  Setup Time 08/19/2012 17:10   Final    Culture NO GROWTH 5 DAYS   Final    Report Status 08/25/2012 FINAL   Final   WOUND CULTURE     Status: Normal   Collection Time   08/19/12  2:14 PM      Component Value Range Status Comment   Specimen Description WOUND LEFT FOREARM   Final    Special Requests NONE   Final    Gram Stain     Final    Value: FEW WBC PRESENT, PREDOMINANTLY PMN     NO SQUAMOUS EPITHELIAL CELLS SEEN     NO ORGANISMS SEEN   Culture MODERATE GROUP A STREP (S.PYOGENES) ISOLATED   Final    Report Status 08/21/2012 FINAL   Final   MRSA PCR SCREENING     Status: Normal   Collection Time   09/12/12  2:23 AM      Component Value Range Status Comment   MRSA by PCR NEGATIVE  NEGATIVE Final     Medical  History: Past Medical History  Diagnosis Date  . Diabetes mellitus without complication   . Hypertension   . IBS (irritable bowel syndrome)   . OSA (obstructive sleep apnea)   . Mood disorder   . Asthma   . Shortness of breath   . Chronic kidney disease     States she has kidney disease  . GERD (gastroesophageal reflux disease)        Assessment: 22 YOF with arm cellulitis started on vancomycin and Zosyn earlier today.  Patient received vancomycin 2250mg  IV load, then antibiotics were discontinued.  Pharmacy consulted to restart vancomycin and change Zosyn to Levaquin.  Noted patient was on Keflex and Septra PTA.  She has acute renal failure in setting of multiple nephrotoxic maintenance medications.  Vanc 11/13 >> LVQ 11/13 >> Zosyn 11/13 >> 11/13   Goal of Therapy:  Vancomycin trough level 10-15 mcg/ml   Plan:  - Vanc 1500mg  IV Q48H, start 09/14/12 - Levaquin 750mg  IV x 1, then 500mg  IV Q48H - Monitor for renal  recovery to adjust antibiotics    Dustie Brittle D. Laney Potash, PharmD, BCPS Pager:  [phone] 09/12/2012, 7:13 PM

## 2012-09-12 NOTE — Progress Notes (Signed)
Inpatient Diabetes Program Recommendations  AACE/ADA: New Consensus Statement on Inpatient Glycemic Control (2013)  Target Ranges:  Prepandial:   less than 140 mg/dL      Peak postprandial:   less than 180 mg/dL (1-2 hours)      Critically ill patients:  140 - 180 mg/dL   Reason for Visit: Hypoglycemia   Inpatient Diabetes Program Recommendations Insulin - Basal: Noted levemir odered at home dose.  Pt may not need to start as high as home dose.  Might want to consider starting at 40 units levemir at HS to start. Correction (SSI): Pt now ordered diet.  Please change correction to tidwc (and HS scale).  Note: Thank you, Lenor Coffin, RN, CNS, Diabetes Coordinator [phone])

## 2012-09-13 ENCOUNTER — Inpatient Hospital Stay (HOSPITAL_COMMUNITY): Payer: Medicare Other

## 2012-09-13 DIAGNOSIS — IMO0002 Reserved for concepts with insufficient information to code with codable children: Secondary | ICD-10-CM

## 2012-09-13 DIAGNOSIS — R6521 Severe sepsis with septic shock: Secondary | ICD-10-CM

## 2012-09-13 DIAGNOSIS — E875 Hyperkalemia: Secondary | ICD-10-CM

## 2012-09-13 DIAGNOSIS — N179 Acute kidney failure, unspecified: Secondary | ICD-10-CM

## 2012-09-13 DIAGNOSIS — E872 Acidosis: Secondary | ICD-10-CM

## 2012-09-13 DIAGNOSIS — A419 Sepsis, unspecified organism: Secondary | ICD-10-CM

## 2012-09-13 DIAGNOSIS — I959 Hypotension, unspecified: Secondary | ICD-10-CM

## 2012-09-13 LAB — BASIC METABOLIC PANEL
BUN: 50 mg/dL — ABNORMAL HIGH (ref 6–23)
Chloride: 106 mEq/L (ref 96–112)
GFR calc Af Amer: 13 mL/min — ABNORMAL LOW (ref >90)
GFR calc non Af Amer: 11 mL/min — ABNORMAL LOW (ref >90)
Potassium: 4.8 mEq/L (ref 3.5–5.1)
Sodium: 137 mEq/L (ref 135–145)

## 2012-09-13 LAB — URINE CULTURE
Colony Count: NO GROWTH
Culture: NO GROWTH

## 2012-09-13 LAB — GLUCOSE, CAPILLARY
Glucose-Capillary: 110 mg/dL — ABNORMAL HIGH (ref 70–99)
Glucose-Capillary: 225 mg/dL — ABNORMAL HIGH (ref 70–99)
Glucose-Capillary: 328 mg/dL — ABNORMAL HIGH (ref 70–99)
Glucose-Capillary: 384 mg/dL — ABNORMAL HIGH (ref 70–99)
Glucose-Capillary: 81 mg/dL (ref 70–99)

## 2012-09-13 LAB — TROPONIN I
Troponin I: <0.3 ng/mL (ref <0.30)
Troponin I: 0.97 ng/mL (ref <0.30)

## 2012-09-13 LAB — CBC
Hemoglobin: 7.7 g/dL — ABNORMAL LOW (ref 12.0–15.0)
MCV: 90.7 fL (ref 78.0–100.0)
Platelets: 154 10*3/uL (ref 150–400)
RBC: 2.59 MIL/uL — ABNORMAL LOW (ref 3.87–5.11)
WBC: 4 10*3/uL (ref 4.0–10.5)

## 2012-09-13 LAB — RENAL FUNCTION PANEL
Albumin: 2.6 g/dL — ABNORMAL LOW (ref 3.5–5.2)
Chloride: 108 mEq/L (ref 96–112)
GFR calc Af Amer: 11 mL/min — ABNORMAL LOW (ref >90)
GFR calc non Af Amer: 9 mL/min — ABNORMAL LOW (ref >90)
Potassium: 4.5 mEq/L (ref 3.5–5.1)

## 2012-09-13 LAB — HEMOGLOBIN A1C: Mean Plasma Glucose: 154 mg/dL — ABNORMAL HIGH (ref <117)

## 2012-09-13 LAB — PROTIME-INR: Prothrombin Time: 16 seconds — ABNORMAL HIGH (ref 11.6–15.2)

## 2012-09-13 LAB — PROCALCITONIN: Procalcitonin: 0.1 ng/mL

## 2012-09-13 MED ORDER — MORPHINE SULFATE 2 MG/ML IJ SOLN
2.0000 mg | Freq: Once | INTRAMUSCULAR | Status: AC
  Administered 2012-09-13: 2 mg via INTRAVENOUS
  Filled 2012-09-13: qty 1

## 2012-09-13 MED ORDER — WHITE PETROLATUM GEL
Status: AC
  Administered 2012-09-13: 0.2
  Filled 2012-09-13: qty 5

## 2012-09-13 MED ORDER — SODIUM CHLORIDE 0.9 % IV BOLUS (SEPSIS)
500.0000 mL | Freq: Once | INTRAVENOUS | Status: AC
  Administered 2012-09-13: 500 mL via INTRAVENOUS

## 2012-09-13 MED ORDER — ONDANSETRON HCL 4 MG/2ML IJ SOLN
4.0000 mg | INTRAMUSCULAR | Status: DC | PRN
  Administered 2012-09-13: 4 mg via INTRAVENOUS

## 2012-09-13 MED ORDER — DOPAMINE-DEXTROSE 3.2-5 MG/ML-% IV SOLN
2.0000 ug/kg/min | INTRAVENOUS | Status: DC
  Administered 2012-09-13: 2 ug/kg/min via INTRAVENOUS
  Filled 2012-09-13: qty 250

## 2012-09-13 MED ORDER — NOREPINEPHRINE BITARTRATE 1 MG/ML IJ SOLN
2.0000 ug/min | INTRAMUSCULAR | Status: DC
  Administered 2012-09-13: 2 ug/min via INTRAVENOUS
  Administered 2012-09-14: 3 ug/min via INTRAVENOUS
  Filled 2012-09-13 (×2): qty 4

## 2012-09-13 MED ORDER — PIPERACILLIN-TAZOBACTAM IN DEX 2-0.25 GM/50ML IV SOLN
2.2500 g | Freq: Four times a day (QID) | INTRAVENOUS | Status: DC
  Administered 2012-09-13 – 2012-09-14 (×4): 2.25 g via INTRAVENOUS
  Filled 2012-09-13 (×6): qty 50

## 2012-09-13 MED ORDER — ONDANSETRON HCL 4 MG/2ML IJ SOLN
INTRAMUSCULAR | Status: AC
  Filled 2012-09-13: qty 2

## 2012-09-13 MED ORDER — HYDROCORTISONE SOD SUCCINATE 100 MG IJ SOLR
50.0000 mg | Freq: Four times a day (QID) | INTRAMUSCULAR | Status: DC
  Administered 2012-09-13 – 2012-09-15 (×8): 50 mg via INTRAVENOUS
  Filled 2012-09-13 (×12): qty 1

## 2012-09-13 MED ORDER — SODIUM CHLORIDE 0.9 % IV SOLN
INTRAVENOUS | Status: DC
  Administered 2012-09-13: 12:00:00 via INTRAVENOUS

## 2012-09-13 MED ORDER — ASPIRIN EC 81 MG PO TBEC
81.0000 mg | DELAYED_RELEASE_TABLET | Freq: Every day | ORAL | Status: DC
  Administered 2012-09-14 – 2012-09-17 (×4): 81 mg via ORAL
  Filled 2012-09-13 (×5): qty 1

## 2012-09-13 MED ORDER — PROMETHAZINE HCL 25 MG/ML IJ SOLN: 12.5000 mg | Freq: Four times a day (QID) | INTRAMUSCULAR | Status: DC | PRN

## 2012-09-13 NOTE — Consult Note (Addendum)
PULMONARY  / CRITICAL CARE MEDICINE  Name: Angie Wang MRN: [id number] DOB: 09-27-1946    LOS: 2  REFERRING MD :  Emmie Niemann Sharon Seller)   CHIEF COMPLAINT:  Hypotension   BRIEF PATIENT DESCRIPTION:  66yo female with mult medical problems admitted 11/12 with LUE cellulitis, AMS and acute renal failure with hyperkalemia.  11/13 tx to ICU r/t hypotension and PCCM consulted.   LINES / TUBES: R IJ CVL 11/14>>>  CULTURES: BCx2 11/12>>> Urine 11/12>>>  ANTIBIOTICS: Vancomycin 11/13>>> Levaquin 11/13>>>  SIGNIFICANT EVENTS:  11/14 hypotension, tx ICU, dopamine  LEVEL OF CARE:  ICU PRIMARY SERVICE:  pccm CONSULTANTS:  Renal  CODE STATUS: full  DIET:  NPO DVT Px:  SQ heparin  GI Px:  protonix  HISTORY OF PRESENT ILLNESS:  66yo female with multiple medical problems including DM, HTN, OSA, CKD.  Presented 11/12 with AMS.  She was being treated for LUE cellulitis with bactrim at home.  Found to have acute on chronic renal failure with hyperkalemia likely r/t significant dehydration, ACE, metformin, diuretics and Bactrim.  Was seen by renal, nephrotoxic agents held, given volume but cont to have hypotension and was tx to ICU 11/14 and PCCM consulted.    PAST MEDICAL HISTORY :  Past Medical History  Diagnosis Date  . Diabetes mellitus without complication   . Hypertension   . IBS (irritable bowel syndrome)   . OSA (obstructive sleep apnea)   . Mood disorder   . Asthma   . Shortness of breath   . Chronic kidney disease     States she has kidney disease  . GERD (gastroesophageal reflux disease)    Past Surgical History  Procedure Date  . Total knee arthroplasty     bilateral  . Cholecystectomy   . Carpel tunnel   . Ovarian cyst removal    Prior to Admission medications   Medication Sig Start Date End Date Taking? Authorizing Provider  acetaminophen (TYLENOL) 500 MG tablet Take 500 mg by mouth every 6 (six) hours as needed. For pain 08/24/12  Yes Renae Fickle, MD  albuterol  (PROVENTIL HFA;VENTOLIN HFA) 108 (90 BASE) MCG/ACT inhaler Inhale 2 puffs into the lungs every 6 (six) hours as needed. For shortness of breath   Yes Historical Provider, MD  amitriptyline (ELAVIL) 25 MG tablet Take 25 mg by mouth at bedtime.   Yes Historical Provider, MD  celecoxib (CELEBREX) 200 MG capsule Take 200 mg by mouth 2 (two) times daily.   Yes Historical Provider, MD  cephALEXin (KEFLEX) 500 MG capsule Take 1 capsule (500 mg total) by mouth 4 (four) times daily. 08/24/12  Yes Renae Fickle, MD  esomeprazole (NEXIUM) 40 MG capsule Take 40 mg by mouth daily before breakfast.   Yes Historical Provider, MD  Folic Acid-Vit B6-Vit B12 (FOLBEE) 2.5-25-1 MG TABS Take 1 tablet by mouth daily.   Yes Historical Provider, MD  gabapentin (NEURONTIN) 600 MG tablet Take 600 mg by mouth 2 (two) times daily.   Yes Historical Provider, MD  glimepiride (AMARYL) 4 MG tablet Take 4 mg by mouth 2 (two) times daily.   Yes Historical Provider, MD  insulin aspart (NOVOLOG) 100 UNIT/ML injection Inject 0-20 Units into the skin 3 (three) times daily with meals. 08/24/12  Yes Renae Fickle, MD  insulin detemir (LEVEMIR) 100 UNIT/ML injection Inject 50 Units into the skin at bedtime. 08/24/12  Yes Renae Fickle, MD  Liraglutide (VICTOZA) 18 MG/3ML SOLN Inject 18 mg into the skin every morning.   Yes Historical Provider,  MD  lisinopril-hydrochlorothiazide (PRINZIDE,ZESTORETIC) 20-25 MG per tablet Take 1 tablet by mouth daily.   Yes Historical Provider, MD  metFORMIN (GLUCOPHAGE) 1000 MG tablet Take 1,000 mg by mouth 2 (two) times daily with a meal.   Yes Historical Provider, MD  PARoxetine (PAXIL) 40 MG tablet Take 40 mg by mouth every morning.   Yes Historical Provider, MD  sulfamethoxazole-trimethoprim (BACTRIM DS) 800-160 MG per tablet Take 2 tablets by mouth every 12 (twelve) hours. 08/24/12  Yes Renae Fickle, MD  traMADol (ULTRAM) 50 MG tablet Take 50 mg by mouth every 8 (eight) hours as needed. For pain  08/24/12  Yes Renae Fickle, MD  Vitamin D, Ergocalciferol, (DRISDOL) 50000 UNITS CAPS Take 50,000 Units by mouth every 7 (seven) days. On Friday   Yes Historical Provider, MD   No Known Allergies  FAMILY HISTORY:  History reviewed. No pertinent family history.  SOCIAL HISTORY:  reports that she has never smoked. She has never used smokeless tobacco. She reports that she does not drink alcohol or use illicit drugs.  REVIEW OF SYSTEMS:  Negative other than above.   VITAL SIGNS: Temp:  [97.2 F (36.2 C)-97.8 F (36.6 C)] 97.8 F (36.6 C) (11/14 0800) Pulse Rate:  [66-126] 123  (11/14 1000) Resp:  [10-25] 21  (11/14 1000) BP: (64-135)/(28-102) 82/37 mmHg (11/14 1000) SpO2:  [91 %-100 %] 94 % (11/14 1000) HEMODYNAMICS:   INTAKE / OUTPUT: Intake/Output      11/13 0701 - 11/14 0700 11/14 0701 - 11/15 0700   P.O. 260 240   I.V. (mL/kg) 1356.3 (11.7) 1668 (14.4)   Other  250   IV Piggyback 500    Total Intake(mL/kg) 2116.3 (18.3) 2158 (18.7)   Urine (mL/kg/hr) 1765 (0.6) 180   Total Output 1765 180   Net +351.3 +1978        Stool Occurrence 2 x      PHYSICAL EXAMINATION: General:  Chronically ill appearing female. Neuro:  Alert and oriented, following commands. HEENT:  [place]/AT, PERRL, EOM-I, -LAN and -thyromegally. Cardiovascular:  RRR, Nl S1/S2, -M/R/G. Lungs:  CTA bilaterally. Abdomen:  Soft, NT, ND and +BS. Musculoskeletal:  Intact. Skin:  Left hand cellulitic changes and area of ulceration consistent with the previous history of MRSA cellulitis.   LABS:  Lab 09/13/12 0519 09/12/12 1814 09/12/12 1647 09/12/12 0938 09/12/12 0854 09/12/12 0255 09/11/12 2258 09/11/12 1927  HGB 7.7* -- -- -- -- 9.2* -- 9.8*  WBC 4.0 -- -- -- -- 7.8 -- 8.8  PLT 154 -- -- -- -- 203 -- 230  NA 141 137 136 -- -- -- -- --  K 4.5 5.0 -- -- -- -- -- --  CL 108 106 105 -- -- -- -- --  CO2 23 18* 16* -- -- -- -- --  GLUCOSE 69* 140* 171* -- -- -- -- --  BUN 56* 61* 64* -- -- -- -- --    CREATININE 4.58* 5.53* 5.46* -- -- -- -- --  CALCIUM 7.9* 8.3* 8.3* -- -- -- -- --  MG -- -- -- -- -- -- -- --  PHOS 4.5 -- 5.7* 6.0* -- -- -- --  AST -- -- -- -- -- -- -- 32  ALT -- -- -- -- -- -- -- 33  ALKPHOS -- -- -- -- -- -- -- 89  BILITOT -- -- -- -- -- -- -- 0.1*  PROT -- -- -- -- -- -- -- 7.1  ALBUMIN 2.6* -- 2.9* 2.9* -- -- -- --  APTT -- -- -- -- -- -- -- --  INR -- -- -- -- -- -- -- --  LATICACIDVEN -- -- -- -- -- -- 2.9* --  TROPONINI -- -- -- -- -- -- -- --  PROCALCITON -- -- -- -- -- -- -- --  PROBNP -- -- -- -- -- -- -- --  O2SATVEN -- -- -- -- -- -- -- --  PHART -- -- -- -- 7.237* -- -- --  PCO2ART -- -- -- -- 39.0 -- -- --  PO2ART -- -- -- -- 91.7 -- -- --    Lab 09/13/12 0816 09/13/12 0405 09/12/12 1938 09/12/12 1607 09/12/12 1221  GLUCAP 56* 110* 184* 154* 77    IMAGING: Dg Chest 2 View  09/11/2012  *RADIOLOGY REPORT*  Clinical Data: Altered mental status  CHEST - 2 VIEW  Comparison: 08/20/2012  Findings: Mild cardiac enlargement.  No pleural effusion or edema identified.  No airspace consolidation identified.  Lung volumes appear low.  IMPRESSION:  1.  No acute cardiopulmonary abnormalities.   Original Report Authenticated By: Signa Kell, M.D.    Ct Head Wo Contrast  09/11/2012  *RADIOLOGY REPORT*  Clinical Data: Altered mental status.  CT HEAD WITHOUT CONTRAST  Technique:  Contiguous axial images were obtained from the base of the skull through the vertex without contrast.  Comparison: 08/18/2012  Findings: No acute intracranial abnormality.  Specifically, no hemorrhage, hydrocephalus, mass lesion, acute infarction, or significant intracranial injury.  No acute calvarial abnormality. Visualized paranasal sinuses and mastoids clear.  Orbital soft tissues unremarkable.  IMPRESSION: No acute intracranial abnormality.   Original Report Authenticated By: Charlett Nose, M.D.    US Renal  09/12/2012  *RADIOLOGY REPORT*  Clinical Data: Acute renal failure,  diabetes, hypertension  RENAL/URINARY TRACT ULTRASOUND COMPLETE  Comparison:  Ultrasound of the kidneys of 12/14/2011  Findings:  Right Kidney:  No hydronephrosis is seen.  The right kidney measures 12.3 cm sagittally.  The parenchyma of the right kidney does appear to be echogenic consistent with chronic renal medical disease.  Left Kidney:  No hydronephrosis is noted.  The left kidney measures 12.2 cm.  The left renal parenchyma also appears echogenic.  Bladder:  The urinary bladder is decompressed with Foley catheter present.  An incidental right adnexal cyst is noted of 5.0 x 4.4 cm.  Pelvic ultrasound may be helpful if further assessment is warranted.  IMPRESSION:  1.  No hydronephrosis.  Somewhat echogenic renal parenchyma consistent with chronic renal medical disease. 2.  5 cm right adnexal cystic structure.  Consider pelvic ultrasound to assess further.   Original Report Authenticated By: Dwyane Dee, M.D.    Dg Chest Port 1 View  09/13/2012  *RADIOLOGY REPORT*  Clinical Data: Status post line insertion.  PORTABLE CHEST - 1 VIEW  Comparison: Chest x-ray 09/11/2012.  Findings: Lung volumes are low.  Right internal jugular central venous catheter with tip terminating at the superior cavoatrial junction.  No pneumothorax.  No consolidative airspace disease.  No pleural effusions.  Pulmonary vasculature is normal.  Heart size is upper limits of normal. The patient is rotated to the left on today's exam, resulting in distortion of the mediastinal contours and reduced diagnostic sensitivity and specificity for mediastinal pathology.  Atherosclerosis in the thoracic aorta.  IMPRESSION: 1.  Right IJ central venous catheter appears properly located.  No pneumothorax. 2.  Low lung volumes without radiographic evidence of acute cardiopulmonary disease. 3.  Atherosclerosis.   Original Report Authenticated By: Trudie Reed, M.D.  ECG:  DIAGNOSES: Principal Problem:  *Acute renal failure Active Problems:   Metabolic encephalopathy  DM II (diabetes mellitus, type II), controlled  HTN (hypertension)  OSA (obstructive sleep apnea)  Cellulitis of upper extremity  Hyperkalemia  Hypotension  Dehydration  ATN (acute tubular necrosis)  Anemia  Metabolic acidosis  Adnexal cyst-right  ASSESSMENT / PLAN:  PULMONARY  ASSESSMENT: Hx OSA  No acute issue   PLAN:   Monitor resp status with aggressive volume resuscitation  CPAP qhs - stopped wearing at home ~ 1 month ago due to unable to get supplies F/u CXR in am   CARDIOVASCULAR  ASSESSMENT:  Hypotension - unclear etiology - likely volume depletion but consider SIRS/sepsis etiology given improved Scr and decent UOP.    Tachycardia  PLAN:  Stat EKG Cardiac panel  Check CVP  Check lactate, pct, cortisol  Stress steroids  Change dopamine gtt to levophed given increasing tachycardia   RENAL  ASSESSMENT:   Acute on chronic renal failure (Baseline Scr ~ 1.12) - multifactorial r/t dehydration and multiple nephrotoxic drugs (on ACE, metformin, diuretic, and recent Bactrim).  Renal u/s neg for hydro.  Hyperkalemia -- improved   PLAN:   Renal following  Cont HCO3 gtt per renal  F/u chem   GASTROINTESTINAL  ASSESSMENT:   Morbid obesity.  PLAN:   NPO for now   HEMATOLOGIC  ASSESSMENT:   Anemia - likely r/t acute illness and dilution in setting aggressive volume resuscitation. No obvious s/s bleeding.   PLAN:  F/u cbc  Transfuse for hgb <7   INFECTIOUS  ASSESSMENT:   LUE cellulitis with sepsis POA.  PLAN:   U/s neg  Broaden abx - ?hx MRSA  ENDOCRINE  ASSESSMENT:   Hx DM    PLAN:   SSI   NEUROLOGIC  ASSESSMENT:   AMS -- in setting acute renal failure, ?sepsis. Much improved.   PLAN:   Supportive care  Avoid oversedation with OSA/ ?OSA  WHITEHEART,KATHRYN, NP 09/13/2012  11:12 AM Pager: [phone] or [phone]  *Care during the described time interval was provided by me and/or other  providers on the critical care team. I have reviewed this patient's available data, including medical history, events of note, physical examination and test results as part of my evaluation.  I have personally obtained a history, examined the patient, evaluated laboratory and imaging results, formulated the assessment and plan and placed orders.  CRITICAL CARE: The patient is critically ill with multiple organ systems failure and requires high complexity decision making for assessment and support, frequent evaluation and titration of therapies, application of advanced monitoring technologies and extensive interpretation of multiple databases. Critical Care Time devoted to patient care services described in this note is 45 minutes.   Will hold in ICU, modified sepsis protocol given renal function, will order echo, broaden abx and cultures pending.  No need for intubation at this time, will check CVP and echo.  Hydrate and monitor CVP.  Patient seen and examined, agree with above note.  I dictated the care and orders written for this patient under my direction.  Koren Bound, M.D. [phone]

## 2012-09-13 NOTE — Procedures (Signed)
Central Venous Catheter Insertion Procedure Note Angie Wang [id number] [birth year]  Procedure: Insertion of Central Venous Catheter Indications: Assessment of intravascular volume, Drug and/or fluid administration and Frequent blood sampling  Procedure Details Consent: Risks of procedure as well as the alternatives and risks of each were explained to the (patient/caregiver).  Consent for procedure obtained. Time Out: Verified patient identification, verified procedure, site/side was marked, verified correct patient position, special equipment/implants available, medications/allergies/relevent history reviewed, required imaging and test results available.  Performed  Maximum sterile technique was used including antiseptics, cap, gloves, gown, hand hygiene, mask and sheet. Skin prep: Chlorhexidine; local anesthetic administered A antimicrobial bonded/coated triple lumen catheter was placed in the right internal jugular vein under real time ultra sound, using the Seldinger technique.  Evaluation Blood flow good Complications: No apparent complications Patient did tolerate procedure well. Chest X-ray ordered to verify placement.  CXR: pending.  U/S used in placement.  Rosebrock, Richard A 09/13/2012, 9:28 AM  Patient seen and examined, agree with above note.  I dictated the care and orders written for this patient under my direction.  Koren Bound, M.D. [phone]

## 2012-09-13 NOTE — Progress Notes (Signed)
Patient ID: Angie Wang, female   DOB: January 15, 1946, 66 y.o.   MRN: [id number] S:resting, arousable, no complaints O:BP 60/51  Pulse 91  Temp 97.8 F (36.6 C) (Axillary)  Resp 21  Ht 5\' 2"  (1.575 m)  Wt 115.7 kg (255 lb 1.2 oz)  BMI 46.65 kg/m2  SpO2 99%  Intake/Output Summary (Last 24 hours) at 09/13/12 1148 Last data filed at 09/13/12 1000  Gross per 24 hour  Intake 3391.36 ml  Output   1295 ml  Net 2096.36 ml   Intake/Output: I/O last 3 completed shifts: In: 3008 [P.O.:260; I.V.:1748; IV Piggyback:1000] Out: 1765 [Urine:1765]  Intake/Output this shift:  Total I/O In: 2158 [P.O.:240; I.V.:1668; Other:250] Out: 180 [Urine:180] Weight change:  Gen:WD obese WF somnolent but in NAD CVS:no rub Resp:cta NWG:NFAOZ Ext:+excoriations/ecchymosis/edema of upper ext   Lab 09/13/12 0519 09/12/12 1814 09/12/12 1647 09/12/12 0938 09/12/12 0515 09/12/12 0255 09/11/12 2258 09/11/12 1927  NA 141 137 136 140 -- -- -- 133*  K 4.5 5.0 5.2* 5.8* 6.0* 6.3* 6.1* --  CL 108 106 105 109 -- -- -- 99  CO2 23 18* 16* 17* -- -- -- 16*  GLUCOSE 69* 140* 171* 80 -- -- -- 77  BUN 56* 61* 64* 65* -- -- -- 68*  CREATININE 4.58* 5.53* 5.46* 6.03* -- 6.13* -- 6.08*  ALBUMIN 2.6* -- 2.9* 2.9* -- -- -- 3.6  CALCIUM 7.9* 8.3* 8.3* 8.5 -- -- -- 9.3  PHOS 4.5 -- 5.7* 6.0* -- -- -- --  AST -- -- -- -- -- -- -- 32  ALT -- -- -- -- -- -- -- 33   Liver Function Tests:  Lab 09/13/12 0519 09/12/12 1647 09/12/12 0938 09/11/12 1927  AST -- -- -- 32  ALT -- -- -- 33  ALKPHOS -- -- -- 89  BILITOT -- -- -- 0.1*  PROT -- -- -- 7.1  ALBUMIN 2.6* 2.9* 2.9* --   No results found for this basename: LIPASE:3,AMYLASE:3 in the last 168 hours No results found for this basename: AMMONIA:3 in the last 168 hours CBC:  Lab 09/13/12 0519 09/12/12 0255 09/11/12 1927  WBC 4.0 7.8 8.8  NEUTROABS -- -- --  HGB 7.7* 9.2* 9.8*  HCT 23.5* 28.4* 30.0*  MCV 90.7 92.8 91.5  PLT 154 203 230   Cardiac Enzymes:  Lab  09/13/12 1051 09/12/12 0938  CKTOTAL -- 139  CKMB -- --  CKMBINDEX -- --  TROPONINI <0.30 --   CBG:  Lab 09/13/12 0816 09/13/12 0405 09/12/12 1938 09/12/12 1607 09/12/12 1221  GLUCAP 56* 110* 184* 154* 77    Iron Studies: No results found for this basename: IRON,TIBC,TRANSFERRIN,FERRITIN in the last 72 hours Studies/Results: Dg Chest 2 View  09/11/2012  *RADIOLOGY REPORT*  Clinical Data: Altered mental status  CHEST - 2 VIEW  Comparison: 08/20/2012  Findings: Mild cardiac enlargement.  No pleural effusion or edema identified.  No airspace consolidation identified.  Lung volumes appear low.  IMPRESSION:  1.  No acute cardiopulmonary abnormalities.   Original Report Authenticated By: Signa Kell, M.D.    Ct Head Wo Contrast  09/11/2012  *RADIOLOGY REPORT*  Clinical Data: Altered mental status.  CT HEAD WITHOUT CONTRAST  Technique:  Contiguous axial images were obtained from the base of the skull through the vertex without contrast.  Comparison: 08/18/2012  Findings: No acute intracranial abnormality.  Specifically, no hemorrhage, hydrocephalus, mass lesion, acute infarction, or significant intracranial injury.  No acute calvarial abnormality. Visualized paranasal sinuses and mastoids clear.  Orbital soft tissues unremarkable.  IMPRESSION: No acute intracranial abnormality.   Original Report Authenticated By: Charlett Nose, M.D.    US Renal  09/12/2012  *RADIOLOGY REPORT*  Clinical Data: Acute renal failure, diabetes, hypertension  RENAL/URINARY TRACT ULTRASOUND COMPLETE  Comparison:  Ultrasound of the kidneys of 12/14/2011  Findings:  Right Kidney:  No hydronephrosis is seen.  The right kidney measures 12.3 cm sagittally.  The parenchyma of the right kidney does appear to be echogenic consistent with chronic renal medical disease.  Left Kidney:  No hydronephrosis is noted.  The left kidney measures 12.2 cm.  The left renal parenchyma also appears echogenic.  Bladder:  The urinary bladder is  decompressed with Foley catheter present.  An incidental right adnexal cyst is noted of 5.0 x 4.4 cm.  Pelvic ultrasound may be helpful if further assessment is warranted.  IMPRESSION:  1.  No hydronephrosis.  Somewhat echogenic renal parenchyma consistent with chronic renal medical disease. 2.  5 cm right adnexal cystic structure.  Consider pelvic ultrasound to assess further.   Original Report Authenticated By: Dwyane Dee, M.D.    Dg Chest Port 1 View  09/13/2012  *RADIOLOGY REPORT*  Clinical Data: Status post line insertion.  PORTABLE CHEST - 1 VIEW  Comparison: Chest x-ray 09/11/2012.  Findings: Lung volumes are low.  Right internal jugular central venous catheter with tip terminating at the superior cavoatrial junction.  No pneumothorax.  No consolidative airspace disease.  No pleural effusions.  Pulmonary vasculature is normal.  Heart size is upper limits of normal. The patient is rotated to the left on today's exam, resulting in distortion of the mediastinal contours and reduced diagnostic sensitivity and specificity for mediastinal pathology.  Atherosclerosis in the thoracic aorta.  IMPRESSION: 1.  Right IJ central venous catheter appears properly located.  No pneumothorax. 2.  Low lung volumes without radiographic evidence of acute cardiopulmonary disease. 3.  Atherosclerosis.   Original Report Authenticated By: Trudie Reed, M.D.       . folic acid-pyridoxine-cyancobalamin  1 tablet Oral Daily  . heparin  5,000 Units Subcutaneous Q8H  . hydrocortisone sod succinate (SOLU-CORTEF) injection  50 mg Intravenous Q6H  . insulin aspart  0-9 Units Subcutaneous Q4H  . insulin detemir  40 Units Subcutaneous QHS  . [COMPLETED] levofloxacin (LEVAQUIN) IV  750 mg Intravenous Once  . [COMPLETED]  morphine injection  2 mg Intravenous Once  . [COMPLETED] ondansetron      . pantoprazole  40 mg Oral Daily  . PARoxetine  40 mg Oral Daily  . [COMPLETED] sodium chloride  500 mL Intravenous Once  .  [COMPLETED] sodium chloride  500 mL Intravenous Once  . [COMPLETED] sodium chloride  500 mL Intravenous Once  . [COMPLETED] sodium chloride  500 mL Intravenous Once  . sodium chloride  3 mL Intravenous Q12H  . vancomycin  1,500 mg Intravenous Q48H  . Vitamin D (Ergocalciferol)  50,000 Units Oral Q7 days  . [DISCONTINUED] insulin detemir  50 Units Subcutaneous QHS  . [DISCONTINUED] levofloxacin (LEVAQUIN) IV  500 mg Intravenous Q48H    BMET    Component Value Date/Time   NA 141 09/13/2012 0519   K 4.5 09/13/2012 0519   CL 108 09/13/2012 0519   CO2 23 09/13/2012 0519   GLUCOSE 69* 09/13/2012 0519   BUN 56* 09/13/2012 0519   CREATININE 4.58* 09/13/2012 0519   CALCIUM 7.9* 09/13/2012 0519   GFRNONAA 9* 09/13/2012 0519   GFRAA 11* 09/13/2012 0519   CBC  Component Value Date/Time   WBC 4.0 09/13/2012 0519   RBC 2.59* 09/13/2012 0519   HGB 7.7* 09/13/2012 0519   HCT 23.5* 09/13/2012 0519   PLT 154 09/13/2012 0519   MCV 90.7 09/13/2012 0519   MCH 29.7 09/13/2012 0519   MCHC 32.8 09/13/2012 0519   RDW 15.9* 09/13/2012 0519   LYMPHSABS 0.4* 08/18/2012 2338   MONOABS 0.5 08/18/2012 2338   EOSABS 0.0 08/18/2012 2338   BASOSABS 0.0 08/18/2012 2338    Assessment/Plan:  1. AKI/CKD- pt with multiple renal insults including ischemic ATN in the setting of ACE-I, Cox-II Inhibitor, bactrim, metformin, and diuretic. Also on the ddx would be obstruction from sulfa crystals, AIN due to bactrim, papillary necrosis due to COX-II, post-infectious GN, etc. UOP is improving with IVF's as well as serum Creatinine. Also of note is that she had an episode of AKI during her last admission within Wang month's time with Wang similar presentation. Her creatinine improved after stopping lisinopril and was resumed at time of discharge but no f/u creatinines are on record. May need to evaluate renal arteries as well and would NOT resume ACE without outpt followup. 2. Metabolic acidosis- due to #1 and metformin  therapy. Would change IVF to isotonic bicarb and follow 3. Hyperkalemia- due to #1 and 2. Resolved after the start of bicarb gtt.  Cont to follow 4. SIRS/hypotension- agree with volume resuscitation and PCCM eval.  Cortisol level pending. 5. OSA- cpap per primary svc 6. HTN- hold meds 7. Cellulitis- per primary svc. Agree with stopping bactrim 8. AMS- some improvement this am 9. Anemia- will need further workup.  Agree that drop is likely due to dillutional effect. 10. DM- per primary svc 11. Dispo- per primary 12.   Angie Wang

## 2012-09-13 NOTE — Progress Notes (Signed)
CRITICAL VALUE ALERT  Critical value received:  Troponin 0.97  Date of notification:  09/13/2012   Time of notification:  1745  Critical value read back: yes  Nurse who received alert:  Jacqulynn Cadet   MD notified (1st page):  Dr. Vassie Loll  Time of first page:  1749  MD notified (2nd page):  Time of second page:  Responding MD:  Dr. Vassie Loll  Time MD responded:  [phone]

## 2012-09-13 NOTE — Progress Notes (Signed)
Patient placed on cpap 10cmH20 via small nasal mask, with 2lpm humidified 02. Patient is tolerating cpap well at this time. RT will continue to monitor.

## 2012-09-13 NOTE — Progress Notes (Signed)
eLink Physician-Brief Progress Note Patient Name: Angie Wang DOB: 1946/03/22 MRN: [id number]  Date of Service  09/13/2012   HPI/Events of Note   Pos trops  eICU Interventions  ASA   Intervention Category Intermediate Interventions: Diagnostic test evaluation  Raeanna Soberanes V. 09/13/2012, 5:51 PM

## 2012-09-13 NOTE — Progress Notes (Signed)
ANTIBIOTIC CONSULT NOTE - FOLLOW UP  Pharmacy Consult for Zosyn Indication: cellulitis  No Known Allergies  Patient Measurements: Height: 5\' 2"  (157.5 cm) Weight: 255 lb 1.2 oz (115.7 kg) IBW/kg (Calculated) : 50.1   Vital Signs: Temp: 97.8 F (36.6 C) (11/14 0800) Temp src: Axillary (11/14 0800) BP: 60/51 mmHg (11/14 1145) Pulse Rate: 91  (11/14 1145) Intake/Output from previous day: 11/13 0701 - 11/14 0700 In: 2116.3 [P.O.:260; I.V.:1356.3; IV Piggyback:500] Out: 1765 [Urine:1765] Intake/Output from this shift: Total I/O In: 2158 [P.O.:240; I.V.:1668; Other:250] Out: 180 [Urine:180]  Labs:  Basename 09/13/12 0519 09/12/12 1814 09/12/12 1647 09/12/12 0924 09/12/12 0255 09/11/12 1927  WBC 4.0 -- -- -- 7.8 8.8  HGB 7.7* -- -- -- 9.2* 9.8*  PLT 154 -- -- -- 203 230  LABCREA -- -- -- 157.48 -- --  CREATININE 4.58* 5.53* 5.46* -- -- --   Estimated Creatinine Clearance: 14.9 ml/min (by C-G formula based on Cr of 4.58).  Assessment: 66yof started on vanc and levaquin yesterday for LUE cellulitis now to switch to vanc and zosyn. ? Septic shock as she is hypotensive today and is being started on pressors. Also in ARF though sCr decreased from 5.53 to 4.58 today with CrCl 71ml/min.   Vanc 11/13 >> LVQ 11/13 >>11/14 Zosyn 11/14>> 11/12 blood cx>>ngtd 11/13 urine cx>>negative final  Goal of Therapy:  Appropriate zosyn dosing  Plan:  1) Zosyn 2.25g IV q6 2) Continue to follow renal function and adjust dose as needed  Fredrik Rigger 09/13/2012,11:51 AM

## 2012-09-14 ENCOUNTER — Encounter (HOSPITAL_COMMUNITY): Payer: Self-pay

## 2012-09-14 ENCOUNTER — Inpatient Hospital Stay (HOSPITAL_COMMUNITY): Payer: Medicare Other

## 2012-09-14 DIAGNOSIS — D649 Anemia, unspecified: Secondary | ICD-10-CM

## 2012-09-14 DIAGNOSIS — N170 Acute kidney failure with tubular necrosis: Secondary | ICD-10-CM

## 2012-09-14 DIAGNOSIS — G9341 Metabolic encephalopathy: Secondary | ICD-10-CM

## 2012-09-14 DIAGNOSIS — R4182 Altered mental status, unspecified: Secondary | ICD-10-CM

## 2012-09-14 LAB — GLUCOSE, CAPILLARY
Glucose-Capillary: 255 mg/dL — ABNORMAL HIGH (ref 70–99)
Glucose-Capillary: 191 mg/dL — ABNORMAL HIGH (ref 70–99)
Glucose-Capillary: 193 mg/dL — ABNORMAL HIGH (ref 70–99)

## 2012-09-14 LAB — COMPREHENSIVE METABOLIC PANEL
AST: 36 U/L (ref 0–37)
Albumin: 2.4 g/dL — ABNORMAL LOW (ref 3.5–5.2)
BUN: 35 mg/dL — ABNORMAL HIGH (ref 6–23)
Calcium: 7.4 mg/dL — ABNORMAL LOW (ref 8.4–10.5)
Chloride: 106 mEq/L (ref 96–112)
Creatinine, Ser: 2.51 mg/dL — ABNORMAL HIGH (ref 0.50–1.10)
GFR calc non Af Amer: 19 mL/min — ABNORMAL LOW (ref >90)
Total Bilirubin: 0.1 mg/dL — ABNORMAL LOW (ref 0.3–1.2)

## 2012-09-14 LAB — BASIC METABOLIC PANEL
BUN: 43 mg/dL — ABNORMAL HIGH (ref 6–23)
Calcium: 7.5 mg/dL — ABNORMAL LOW (ref 8.4–10.5)
Chloride: 102 mEq/L (ref 96–112)
Creatinine, Ser: 3.19 mg/dL — ABNORMAL HIGH (ref 0.50–1.10)
GFR calc Af Amer: 17 mL/min — ABNORMAL LOW (ref >90)
GFR calc non Af Amer: 14 mL/min — ABNORMAL LOW (ref >90)

## 2012-09-14 LAB — BLOOD GAS, ARTERIAL
Drawn by: 22251
FIO2: 0.21 %
pCO2 arterial: 40.9 mmHg (ref 35.0–45.0)
pH, Arterial: 7.416 (ref 7.350–7.450)
pO2, Arterial: 78.3 mmHg — ABNORMAL LOW (ref 80.0–100.0)

## 2012-09-14 LAB — MAGNESIUM: Magnesium: 1.3 mg/dL — ABNORMAL LOW (ref 1.5–2.5)

## 2012-09-14 LAB — CBC
HCT: 23.9 % — ABNORMAL LOW (ref 36.0–46.0)
MCH: 30.3 pg (ref 26.0–34.0)
MCHC: 33.9 g/dL (ref 30.0–36.0)
MCV: 89.5 fL (ref 78.0–100.0)
Platelets: 185 10*3/uL (ref 150–400)
RDW: 15.1 % (ref 11.5–15.5)

## 2012-09-14 LAB — TROPONIN I
Troponin I: 0.97 ng/mL (ref <0.30)
Troponin I: 0.97 ng/mL (ref <0.30)

## 2012-09-14 MED ORDER — SODIUM CHLORIDE 0.9 % IV BOLUS (SEPSIS)
500.0000 mL | Freq: Once | INTRAVENOUS | Status: AC
  Administered 2012-09-14: 500 mL via INTRAVENOUS

## 2012-09-14 MED ORDER — PIPERACILLIN-TAZOBACTAM 3.375 G IVPB
3.3750 g | Freq: Three times a day (TID) | INTRAVENOUS | Status: DC
  Administered 2012-09-14 – 2012-09-15 (×3): 3.375 g via INTRAVENOUS
  Filled 2012-09-14 (×5): qty 50

## 2012-09-14 MED ORDER — MAGNESIUM SULFATE IN D5W 10-5 MG/ML-% IV SOLN
1.0000 g | Freq: Once | INTRAVENOUS | Status: AC
  Administered 2012-09-14: 1 g via INTRAVENOUS
  Filled 2012-09-14: qty 100

## 2012-09-14 MED ORDER — SODIUM CHLORIDE 0.9 % IV SOLN
INTRAVENOUS | Status: DC
  Administered 2012-09-14: 100 mL/h via INTRAVENOUS
  Administered 2012-09-14 – 2012-09-15 (×2): via INTRAVENOUS

## 2012-09-14 MED ORDER — MAGNESIUM SULFATE 50 % IJ SOLN: 1.0000 g | Freq: Once | INTRAVENOUS | Status: DC

## 2012-09-14 NOTE — Progress Notes (Signed)
Patient ID: Angie Wang, female   DOB: 1946-09-06, 66 y.o.   MRN: [id number] S:feels better, more awake and alert O:BP 118/45  Pulse 67  Temp 98.9 F (37.2 C) (Axillary)  Resp 19  Ht 5\' 2"  (1.575 m)  Wt 115.7 kg (255 lb 1.2 oz)  BMI 46.65 kg/m2  SpO2 97%  Intake/Output Summary (Last 24 hours) at 09/14/12 1150 Last data filed at 09/14/12 1100  Gross per 24 hour  Intake 5650.31 ml  Output   1755 ml  Net 3895.31 ml   Intake/Output: I/O last 3 completed shifts: In: 6827.8 [P.O.:240; I.V.:5737.8; Other:250; IV Piggyback:600] Out: 2425 [Urine:2425]  Intake/Output this shift:  Total I/O In: 1480.6 [P.O.:240; I.V.:590.6; IV Piggyback:650] Out: 345 [Urine:345] Weight change:  Gen:WD obese WF in NAD CVS:no rub Resp:CTa Abd:+bs, soft WUJ:WJXBJ and erythema of left upper ext   Lab 09/14/12 0430 09/13/12 1810 09/13/12 0519 09/12/12 1814 09/12/12 1647 09/12/12 0938 09/12/12 0515 09/12/12 0255 09/11/12 1927  NA 136 137 141 137 136 140 -- -- 133*  K 4.2 4.8 4.5 5.0 5.2* 5.8* 6.0* -- --  CL 102 106 108 106 105 109 -- -- 99  CO2 25 22 23  18* 16* 17* -- -- 16*  GLUCOSE 287* 336* 69* 140* 171* 80 -- -- 77  BUN 43* 50* 56* 61* 64* 65* -- -- 68*  CREATININE 3.19* 3.84* 4.58* 5.53* 5.46* 6.03* -- 6.13* --  ALBUMIN -- -- 2.6* -- 2.9* 2.9* -- -- 3.6  CALCIUM 7.5* 7.3* 7.9* 8.3* 8.3* 8.5 -- -- 9.3  PHOS 2.6 -- 4.5 -- 5.7* 6.0* -- -- --  AST -- -- -- -- -- -- -- -- 32  ALT -- -- -- -- -- -- -- -- 33   Liver Function Tests:  Lab 09/13/12 0519 09/12/12 1647 09/12/12 0938 09/11/12 1927  AST -- -- -- 32  ALT -- -- -- 33  ALKPHOS -- -- -- 89  BILITOT -- -- -- 0.1*  PROT -- -- -- 7.1  ALBUMIN 2.6* 2.9* 2.9* --   No results found for this basename: LIPASE:3,AMYLASE:3 in the last 168 hours No results found for this basename: AMMONIA:3 in the last 168 hours CBC:  Lab 09/14/12 0430 09/13/12 0519 09/12/12 0255 09/11/12 1927  WBC 5.3 4.0 7.8 --  NEUTROABS -- -- -- --  HGB 8.1* 7.7* 9.2*  --  HCT 23.9* 23.5* 28.4* --  MCV 89.5 90.7 92.8 91.5  PLT 185 154 203 --   Cardiac Enzymes:  Lab 09/13/12 2208 09/13/12 1651 09/13/12 1051 09/12/12 0938  CKTOTAL -- -- -- 139  CKMB -- -- -- --  CKMBINDEX -- -- -- --  TROPONINI 1.19* 0.97* <0.30 --   CBG:  Lab 09/14/12 0403 09/13/12 2351 09/13/12 1934 09/13/12 1602 09/13/12 1219  GLUCAP 255* 384* 363* 328* 225*    Iron Studies: No results found for this basename: IRON,TIBC,TRANSFERRIN,FERRITIN in the last 72 hours Studies/Results: US Renal  09/12/2012  *RADIOLOGY REPORT*  Clinical Data: Acute renal failure, diabetes, hypertension  RENAL/URINARY TRACT ULTRASOUND COMPLETE  Comparison:  Ultrasound of the kidneys of 12/14/2011  Findings:  Right Kidney:  No hydronephrosis is seen.  The right kidney measures 12.3 cm sagittally.  The parenchyma of the right kidney does appear to be echogenic consistent with chronic renal medical disease.  Left Kidney:  No hydronephrosis is noted.  The left kidney measures 12.2 cm.  The left renal parenchyma also appears echogenic.  Bladder:  The urinary bladder is decompressed with Foley catheter  present.  An incidental right adnexal cyst is noted of 5.0 x 4.4 cm.  Pelvic ultrasound may be helpful if further assessment is warranted.  IMPRESSION:  1.  No hydronephrosis.  Somewhat echogenic renal parenchyma consistent with chronic renal medical disease. 2.  5 cm right adnexal cystic structure.  Consider pelvic ultrasound to assess further.   Original Report Authenticated By: Dwyane Dee, M.D.    Dg Chest Port 1 View  09/14/2012  *RADIOLOGY REPORT*  Clinical Data: Respiratory failure.  PORTABLE CHEST - 1 VIEW  Comparison: 09/13/2012  Findings: Low lung volumes cardiomegaly and vascular congestion. No overt edema.  No focal opacities or effusions.  No acute bony abnormality.  IMPRESSION: Low lung volumes.  Cardiomegaly, vascular congestion.   Original Report Authenticated By: Charlett Nose, M.D.    Dg Chest Port 1  View  09/13/2012  *RADIOLOGY REPORT*  Clinical Data: Status post line insertion.  PORTABLE CHEST - 1 VIEW  Comparison: Chest x-ray 09/11/2012.  Findings: Lung volumes are low.  Right internal jugular central venous catheter with tip terminating at the superior cavoatrial junction.  No pneumothorax.  No consolidative airspace disease.  No pleural effusions.  Pulmonary vasculature is normal.  Heart size is upper limits of normal. The patient is rotated to the left on today's exam, resulting in distortion of the mediastinal contours and reduced diagnostic sensitivity and specificity for mediastinal pathology.  Atherosclerosis in the thoracic aorta.  IMPRESSION: 1.  Right IJ central venous catheter appears properly located.  No pneumothorax. 2.  Low lung volumes without radiographic evidence of acute cardiopulmonary disease. 3.  Atherosclerosis.   Original Report Authenticated By: Trudie Reed, M.D.       . aspirin EC  81 mg Oral Daily  . folic acid-pyridoxine-cyancobalamin  1 tablet Oral Daily  . heparin  5,000 Units Subcutaneous Q8H  . hydrocortisone sod succinate (SOLU-CORTEF) injection  50 mg Intravenous Q6H  . insulin aspart  0-9 Units Subcutaneous Q4H  . insulin detemir  40 Units Subcutaneous QHS  . magnesium sulfate 1 - 4 g bolus IVPB  1 g Intravenous Once  . pantoprazole  40 mg Oral Daily  . PARoxetine  40 mg Oral Daily  . piperacillin-tazobactam (ZOSYN)  IV  2.25 g Intravenous Q6H  . [COMPLETED] sodium chloride  500 mL Intravenous Once  . sodium chloride  3 mL Intravenous Q12H  . vancomycin  1,500 mg Intravenous Q48H  . Vitamin D (Ergocalciferol)  50,000 Units Oral Q7 days  . [COMPLETED] white petrolatum      . [DISCONTINUED] magnesium sulfate LVP 250-500 ml  1 g Intravenous Once    BMET    Component Value Date/Time   NA 136 09/14/2012 0430   K 4.2 09/14/2012 0430   CL 102 09/14/2012 0430   CO2 25 09/14/2012 0430   GLUCOSE 287* 09/14/2012 0430   BUN 43* 09/14/2012 0430    CREATININE 3.19* 09/14/2012 0430   CALCIUM 7.5* 09/14/2012 0430   GFRNONAA 14* 09/14/2012 0430   GFRAA 17* 09/14/2012 0430   CBC    Component Value Date/Time   WBC 5.3 09/14/2012 0430   RBC 2.67* 09/14/2012 0430   HGB 8.1* 09/14/2012 0430   HCT 23.9* 09/14/2012 0430   PLT 185 09/14/2012 0430   MCV 89.5 09/14/2012 0430   MCH 30.3 09/14/2012 0430   MCHC 33.9 09/14/2012 0430   RDW 15.1 09/14/2012 0430   LYMPHSABS 0.4* 08/18/2012 2338   MONOABS 0.5 08/18/2012 2338   EOSABS 0.0 08/18/2012 2338  BASOSABS 0.0 08/18/2012 2338    Assessment/Plan:  1. AKI/CKD- pt with multiple renal insults including ischemic ATN in the setting of ACE-I, Cox-II Inhibitor, bactrim, metformin, and diuretic. Also on the ddx would be obstruction from sulfa crystals, AIN due to bactrim, papillary necrosis due to COX-II, post-infectious GN, etc. UOP is improving with IVF's as well as serum Creatinine. Also of note is that she had an episode of AKI during her last admission within a month's time with a similar presentation. Her creatinine improved after stopping lisinopril and was resumed at time of discharge but no f/u creatinines are on record. May need to evaluate renal arteries as well and would NOT resume ACE without outpt followup.  Will sign off as she is improving.  Call with questions or concerns.  She normally follows with Dr. Casimiro Needle in our office and can be seen in follow up once stable for discharge. 2. Metabolic acidosis- due to #1 and metformin therapy. Resolved.  Change IVF to isotonic saline and follow 3. Hyperkalemia- due to #1 and 2. Resolved after the start of bicarb gtt. Cont to follow 4. SIRS/hypotension- agree with volume resuscitation and PCCM eval. Cortisol level pending. 5. OSA- cpap per primary svc 6. HTN- hold meds 7. Cellulitis- per primary svc. Agree with stopping bactrim 8. AMS- some improvement this am 9. Anemia- will need further workup. Agree that drop is likely due to  dillutional effect. 10. DM- per primary svc 11. Dispo- per primary.  Will sign off.  Have pt follow up with Dr. Casimiro Needle at [facility] 2-4 weeks after discharge from the hospital. 12.  Icker Swigert A

## 2012-09-14 NOTE — Progress Notes (Signed)
Sterile water with 150 meq of bicarb ordered to run at 100 ml/hr.  At 0930 Dr. Marchelle Gearing ordered to cut bicarb in half to run at 50 ml/hr. At 1030 ABG was drawn with results WNL.  Renal doctor [place] aware of result and okay with turning bicarb drip completely off. Dr. Marchelle Gearing ordered CMP to be drawn at 1500 and call [facility] with results. Will continue to monitor. Milly Jakob A.

## 2012-09-14 NOTE — Progress Notes (Signed)
PULMONARY  / CRITICAL CARE MEDICINE  Name: Angie Wang MRN: [id number] DOB: 08-09-1946    LOS: 3  REFERRING MD :  Emmie Niemann Sharon Seller)   CHIEF COMPLAINT:  Hypotension    HISTORY OF PRESENT ILLNESS:  66yo female with multiple medical problems including obesetity,  DM, HTN, OSA, CKD.  Presented 11/12 with AMS.  She was being treated for LUE cellulitis with bactrim at home.  Found to have acute on chronic renal failure with hyperkalemia likely r/t significant dehydration, ACE, metformin, diuretics and Bactrim.  Was seen by renal, nephrotoxic agents held, given volume but cont to have hypotension and was tx to ICU 11/14 and PCCM consulted.      BRIEF PATIENT DESCRIPTION:  66yo female with mult medical problems admitted 11/12 with LUE cellulitis, AMS and acute renal failure with hyperkalemia.  11/13 tx to ICU r/t hypotension and PCCM consulted.    LEVEL OF CARE:  ICU PRIMARY SERVICE:  pccm CONSULTANTS:  Renal  CODE STATUS: full  DIET:  NPO DVT Px:  SQ heparin  GI Px:  protonix   LINES / TUBES: R IJ CVL 11/14>>>  CULTURES: BCx2 11/12>>> Urine 11/12>>>  ANTIBIOTICS: Vancomycin 11/13>>> Levaquin 11/13>>>  SIGNIFICANT EVENTS:  11/14 hypotension, tx ICU, dopamine     SUBJECTIVE/OVERNIGHT/INTERVAL HX   - just came off levophed. MAP 61. Still on bicarb gtt. Troponin high. ECHO Oct 2013 - ef 55% - did cpap last night  VITAL SIGNS: Temp:  [97.5 F (36.4 C)-98.9 F (37.2 C)] 98.9 F (37.2 C) (11/15 0400) Pulse Rate:  [65-103] 67  (11/15 1000) Resp:  [13-26] 21  (11/15 1000) BP: (60-147)/(33-97) 98/51 mmHg (11/15 1000) SpO2:  [95 %-100 %] 95 % (11/15 1000) HEMODYNAMICS:   INTAKE / OUTPUT: Intake/Output      11/14 0701 - 11/15 0700 11/15 0701 - 11/16 0700   P.O. 240    I.V. (mL/kg) 5737.8 (49.6) 390.6 (3.4)   Other 250    IV Piggyback 100 100   Total Intake(mL/kg) 6327.8 (54.7) 490.6 (4.2)   Urine (mL/kg/hr) 1620 (0.6) 245   Total Output 1620 245   Net +4707.8  +245.6          PHYSICAL EXAMINATION: General:  Chronically ill appearing female. Neuro:  Alert and oriented, following commands. HEENT:  [place]/AT, PERRL, EOM-I, -LAN and -thyromegally. Cardiovascular:  RRR, Nl S1/S2, -M/R/G. Lungs:  CTA bilaterally. Abdomen:  Soft, NT, ND and +BS. Musculoskeletal:  Intact. Skin:  Left hand cellulitic changes and area of ulceration consistent with the previous history of MRSA cellulitis.   LABS:  Lab 09/14/12 0430 09/13/12 2208 09/13/12 1810 09/13/12 1651 09/13/12 1051 09/13/12 1050 09/13/12 0832 09/13/12 0800 09/13/12 0519 09/12/12 1647 09/12/12 0938 09/12/12 0854 09/12/12 0255 09/11/12 2258 09/11/12 1927  HGB 8.1* -- -- -- -- -- -- -- 7.7* -- -- -- 9.2* -- --  WBC 5.3 -- -- -- -- -- -- -- 4.0 -- -- -- 7.8 -- --  PLT 185 -- -- -- -- -- -- -- 154 -- -- -- 203 -- --  NA 136 -- 137 -- -- -- -- -- 141 -- -- -- -- -- --  K 4.2 -- 4.8 -- -- -- -- -- -- -- -- -- -- -- --  CL 102 -- 106 -- -- -- -- -- 108 -- -- -- -- -- --  CO2 25 -- 22 -- -- -- -- -- 23 -- -- -- -- -- --  GLUCOSE 287* -- 336* -- -- -- -- --  69* -- -- -- -- -- --  BUN 43* -- 50* -- -- -- -- -- 56* -- -- -- -- -- --  CREATININE 3.19* -- 3.84* -- -- -- -- -- 4.58* -- -- -- -- -- --  CALCIUM 7.5* -- 7.3* -- -- -- -- -- 7.9* -- -- -- -- -- --  MG 1.3* -- -- -- -- -- -- -- -- -- -- -- -- -- --  PHOS 2.6 -- -- -- -- -- -- -- 4.5 5.7* -- -- -- -- --  AST -- -- -- -- -- -- -- -- -- -- -- -- -- -- 32  ALT -- -- -- -- -- -- -- -- -- -- -- -- -- -- 33  ALKPHOS -- -- -- -- -- -- -- -- -- -- -- -- -- -- 89  BILITOT -- -- -- -- -- -- -- -- -- -- -- -- -- -- 0.1*  PROT -- -- -- -- -- -- -- -- -- -- -- -- -- -- 7.1  ALBUMIN -- -- -- -- -- -- -- -- 2.6* 2.9* 2.9* -- -- -- --  APTT -- -- -- -- -- -- -- -- -- -- -- -- -- -- --  INR -- -- -- -- -- -- 1.31 -- -- -- -- -- -- -- --  LATICACIDVEN -- -- -- -- -- 0.5 -- -- -- -- -- -- -- 2.9* --  TROPONINI -- 1.19* -- 0.97* <0.30 -- -- -- -- -- -- -- -- -- --    PROCALCITON -- -- -- -- -- -- -- 0.10 -- -- -- -- -- -- --  PROBNP -- -- -- -- -- -- -- -- -- -- -- -- -- -- --  O2SATVEN -- -- -- -- -- -- -- -- -- -- -- -- -- -- --  PHART -- -- -- -- -- -- -- -- -- -- -- 7.237* -- -- --  PCO2ART -- -- -- -- -- -- -- -- -- -- -- 39.0 -- -- --  PO2ART -- -- -- -- -- -- -- -- -- -- -- 91.7 -- -- --    Lab 09/14/12 0403 09/13/12 2351 09/13/12 1934 09/13/12 1602 09/13/12 1219  GLUCAP 255* 384* 363* 328* 225*    IMAGING: US Renal  09/12/2012  *RADIOLOGY REPORT*  Clinical Data: Acute renal failure, diabetes, hypertension  RENAL/URINARY TRACT ULTRASOUND COMPLETE  Comparison:  Ultrasound of the kidneys of 12/14/2011  Findings:  Right Kidney:  No hydronephrosis is seen.  The right kidney measures 12.3 cm sagittally.  The parenchyma of the right kidney does appear to be echogenic consistent with chronic renal medical disease.  Left Kidney:  No hydronephrosis is noted.  The left kidney measures 12.2 cm.  The left renal parenchyma also appears echogenic.  Bladder:  The urinary bladder is decompressed with Foley catheter present.  An incidental right adnexal cyst is noted of 5.0 x 4.4 cm.  Pelvic ultrasound may be helpful if further assessment is warranted.  IMPRESSION:  1.  No hydronephrosis.  Somewhat echogenic renal parenchyma consistent with chronic renal medical disease. 2.  5 cm right adnexal cystic structure.  Consider pelvic ultrasound to assess further.   Original Report Authenticated By: Dwyane Dee, M.D.    Dg Chest Port 1 View  09/14/2012  *RADIOLOGY REPORT*  Clinical Data: Respiratory failure.  PORTABLE CHEST - 1 VIEW  Comparison: 09/13/2012  Findings: Low lung volumes cardiomegaly and vascular congestion. No overt edema.  No focal opacities  or effusions.  No acute bony abnormality.  IMPRESSION: Low lung volumes.  Cardiomegaly, vascular congestion.   Original Report Authenticated By: Charlett Nose, M.D.    Dg Chest Port 1 View  09/13/2012  *RADIOLOGY REPORT*   Clinical Data: Status post line insertion.  PORTABLE CHEST - 1 VIEW  Comparison: Chest x-ray 09/11/2012.  Findings: Lung volumes are low.  Right internal jugular central venous catheter with tip terminating at the superior cavoatrial junction.  No pneumothorax.  No consolidative airspace disease.  No pleural effusions.  Pulmonary vasculature is normal.  Heart size is upper limits of normal. The patient is rotated to the left on today's exam, resulting in distortion of the mediastinal contours and reduced diagnostic sensitivity and specificity for mediastinal pathology.  Atherosclerosis in the thoracic aorta.  IMPRESSION: 1.  Right IJ central venous catheter appears properly located.  No pneumothorax. 2.  Low lung volumes without radiographic evidence of acute cardiopulmonary disease. 3.  Atherosclerosis.   Original Report Authenticated By: Trudie Reed, M.D.    ECG:  DIAGNOSES: Principal Problem:  *Acute renal failure Active Problems:  Metabolic encephalopathy  DM II (diabetes mellitus, type II), controlled  HTN (hypertension)  OSA (obstructive sleep apnea)  Cellulitis of upper extremity  Hyperkalemia  Hypotension  Dehydration  ATN (acute tubular necrosis)  Anemia  Metabolic acidosis  Adnexal cyst-right  Septic shock  ASSESSMENT / PLAN:  PULMONARY  ASSESSMENT: Hx OSA  No acute issue  - 09/14/12 - maintaining normal airway and resp status  PLAN:   Monitor resp status  CPAP qhs - stopped wearing at home ~ 1 month ago due to unable to get supplies F/u CXRprn  CARDIOVASCULAR  ASSESSMENT:  Hypotension - unclear etiology - likely volume depletion but consider SIRS/sepsis etiology given improved Scr and decent UOP.     On 09/14/12 - off levophed but tropinins going up - possibly NSTEMI-2 due to sepsis/ATN  PLAN:  Continue stress steroids; dc 1116/13 if still off pressors Monitor troponin trend, if rising call cards 9  RENAL  ASSESSMENT:   Acute on chronic renal failure  (Baseline Scr ~ 1.12) - multifactorial r/t dehydration and multiple nephrotoxic drugs (on ACE, metformin, diuretic, and recent Bactrim).  Renal u/s neg for hydro.  On 09/14/12: Hyperkalemia,, Creat and Bic  -- improved bu ton bic 100cc/h   PLAN:   Renal following  Cont HCO3 gtt per renal ; but will reduce to 50cc/h and check abg. IF abg ok, wean bic further F/u chem   GASTROINTESTINAL  ASSESSMENT:   Morbid obesity.  PLAN:   NPO for now   HEMATOLOGIC  ASSESSMENT:   Anemia - likely r/t acute illness and dilution in setting aggressive volume resuscitation. No obvious s/s bleeding.   PLAN:  F/u cbc  Transfuse for hgb <7 (< 8 for NSTEMI)  INFECTIOUS  ASSESSMENT:   LUE cellulitis with sepsis POA.  PLAN:   Broaden abx - ?hx MRSA  ENDOCRINE  ASSESSMENT:   Hx DM    PLAN:   SSI   NEUROLOGIC  ASSESSMENT:   AMS -- in setting acute renal failure, ?sepsis.   on 09/14/12 - Much improved.   PLAN:   Supportive care  Avoid oversedation with OSA/ ?OSA    Dr. Kalman Shan, M.D., [facility].C.P Pulmonary and Critical Care Medicine Staff Physician [facility] [place] [facility] Pager: [phone], If no answer or between  15:00h - 7:00h: call [phone]  09/14/2012 10:40 AM

## 2012-09-14 NOTE — Progress Notes (Signed)
Patient did not want to wear cpap tonight. Patient told RT that if she changed her mind she would get RN to call RT. RT will  Continue to monitor.

## 2012-09-14 NOTE — Progress Notes (Signed)
Inpatient Diabetes Program Recommendations  AACE/ADA: New Consensus Statement on Inpatient Glycemic Control (2013)  Target Ranges:  Prepandial:   less than 140 mg/dL      Peak postprandial:   less than 180 mg/dL (1-2 hours)      Critically ill patients:  140 - 180 mg/dL   Reason for Visit: CBGs 09/14/12  [phone] mg/dl          Inpatient Diabetes Program Recommendations Insulin - Basal: . May need to titrate Levemir dosage up if fasting CBGs continue greater than 180 mg/dl. Correction (SSI): . Change Novolog correction scale to TID with meals. Insulin - Meal Coverage: Add Novolog 3 units TID with meals.   Note: Postprandial CBGs tend to go up with steroids.

## 2012-09-15 DIAGNOSIS — E119 Type 2 diabetes mellitus without complications: Secondary | ICD-10-CM

## 2012-09-15 DIAGNOSIS — G4733 Obstructive sleep apnea (adult) (pediatric): Secondary | ICD-10-CM

## 2012-09-15 LAB — GLUCOSE, CAPILLARY
Glucose-Capillary: 249 mg/dL — ABNORMAL HIGH (ref 70–99)
Glucose-Capillary: 203 mg/dL — ABNORMAL HIGH (ref 70–99)
Glucose-Capillary: 222 mg/dL — ABNORMAL HIGH (ref 70–99)

## 2012-09-15 LAB — BASIC METABOLIC PANEL
BUN: 33 mg/dL — ABNORMAL HIGH (ref 6–23)
Chloride: 103 mEq/L (ref 96–112)
Creatinine, Ser: 2.26 mg/dL — ABNORMAL HIGH (ref 0.50–1.10)
GFR calc Af Amer: 25 mL/min — ABNORMAL LOW (ref >90)
GFR calc non Af Amer: 22 mL/min — ABNORMAL LOW (ref >90)
Potassium: 3.7 mEq/L (ref 3.5–5.1)

## 2012-09-15 LAB — PHOSPHORUS: Phosphorus: 2.7 mg/dL (ref 2.3–4.6)

## 2012-09-15 LAB — PROCALCITONIN: Procalcitonin: 0.44 ng/mL

## 2012-09-15 LAB — TROPONIN I: Troponin I: 0.82 ng/mL (ref <0.30)

## 2012-09-15 MED ORDER — LEVOFLOXACIN 500 MG PO TABS
500.0000 mg | ORAL_TABLET | Freq: Every day | ORAL | Status: DC
  Administered 2012-09-15: 500 mg via ORAL
  Filled 2012-09-15: qty 1

## 2012-09-15 MED ORDER — ALBUTEROL SULFATE (5 MG/ML) 0.5% IN NEBU
2.5000 mg | INHALATION_SOLUTION | RESPIRATORY_TRACT | Status: DC | PRN
  Administered 2012-09-16 (×2): 2.5 mg via RESPIRATORY_TRACT
  Filled 2012-09-15 (×2): qty 0.5

## 2012-09-15 MED ORDER — INSULIN ASPART 100 UNIT/ML ~~LOC~~ SOLN: 0.0000 [IU] | Freq: Every day | SUBCUTANEOUS | Status: DC

## 2012-09-15 MED ORDER — HYDROCORTISONE SOD SUCCINATE 100 MG IJ SOLR
50.0000 mg | Freq: Two times a day (BID) | INTRAMUSCULAR | Status: DC
  Administered 2012-09-15 – 2012-09-16 (×2): 50 mg via INTRAVENOUS
  Filled 2012-09-15 (×4): qty 1

## 2012-09-15 MED ORDER — LEVOFLOXACIN 250 MG PO TABS
250.0000 mg | ORAL_TABLET | Freq: Every day | ORAL | Status: DC
  Filled 2012-09-15: qty 1

## 2012-09-15 MED ORDER — INSULIN ASPART 100 UNIT/ML ~~LOC~~ SOLN
0.0000 [IU] | Freq: Three times a day (TID) | SUBCUTANEOUS | Status: DC
  Administered 2012-09-15: 8 [IU] via SUBCUTANEOUS
  Administered 2012-09-15: 5 [IU] via SUBCUTANEOUS

## 2012-09-15 NOTE — Progress Notes (Signed)
ANTIBIOTIC CONSULT NOTE - FOLLOW UP  Pharmacy Consult for levofloxacin Indication: LUE cellulits/sepsis  No Known Allergies  Patient Measurements: Height: 5\' 2"  (157.5 cm) Weight: 258 lb 6.1 oz (117.2 kg) IBW/kg (Calculated) : 50.1    Vital Signs: Temp: 98.8 F (37.1 C) (11/16 1419) Temp src: Oral (11/16 1419) BP: 163/78 mmHg (11/16 1419) Pulse Rate: 76  (11/16 1419) Intake/Output from previous day: 11/15 0701 - 11/16 0700 In: 3803.9 [P.O.:710; I.V.:2193.9; IV Piggyback:900] Out: 1715 [Urine:1715] Intake/Output from this shift: Total I/O In: 600 [P.O.:200; I.V.:400] Out: 428 [Urine:425; Stool:3]  Labs:  [facility] 09/15/12 0238 09/14/12 1510 09/14/12 0430 09/13/12 0519  WBC -- -- 5.3 4.0  HGB -- -- 8.1* 7.7*  PLT -- -- 185 154  LABCREA -- -- -- --  CREATININE 2.26* 2.51* 3.19* --   Estimated Creatinine Clearance: 30.5 ml/min (by C-G formula based on Cr of 2.26). No results found for this basename: VANCOTROUGH:2,VANCOPEAK:2,VANCORANDOM:2,GENTTROUGH:2,GENTPEAK:2,GENTRANDOM:2,TOBRATROUGH:2,TOBRAPEAK:2,TOBRARND:2,AMIKACINPEAK:2,AMIKACINTROU:2,AMIKACIN:2, in the last 72 hours   Microbiology: Recent Results (from the past 720 hour(s))  URINE CULTURE     Status: Normal   Collection Time   08/18/12 10:26 PM      Component Value Range Status Comment   Specimen Description URINE, RANDOM   Final    Special Requests QM:VHQIO ON [id number] @2348    Final    Culture  Setup Time 08/19/2012 17:13   Final    Colony Count NO GROWTH   Final    Culture NO GROWTH   Final    Report Status 08/20/2012 FINAL   Final   CULTURE, BLOOD (ROUTINE X 2)     Status: Normal   Collection Time   08/18/12 11:30 PM      Component Value Range Status Comment   Specimen Description BLOOD LEFT ARM   Final    Special Requests BOTTLES DRAWN AEROBIC ONLY 10CC   Final    Culture  Setup Time 08/19/2012 17:10   Final    Culture NO GROWTH 5 DAYS   Final    Report Status 08/25/2012 FINAL   Final   CULTURE, BLOOD  (ROUTINE X 2)     Status: Normal   Collection Time   08/18/12 11:36 PM      Component Value Range Status Comment   Specimen Description BLOOD RIGHT ARM   Final    Special Requests BOTTLES DRAWN AEROBIC ONLY 10CC   Final    Culture  Setup Time 08/19/2012 17:10   Final    Culture NO GROWTH 5 DAYS   Final    Report Status 08/25/2012 FINAL   Final   WOUND CULTURE     Status: Normal   Collection Time   08/19/12  2:14 PM      Component Value Range Status Comment   Specimen Description WOUND LEFT FOREARM   Final    Special Requests NONE   Final    Gram Stain     Final    Value: FEW WBC PRESENT, PREDOMINANTLY PMN     NO SQUAMOUS EPITHELIAL CELLS SEEN     NO ORGANISMS SEEN   Culture MODERATE GROUP A STREP (S.PYOGENES) ISOLATED   Final    Report Status 08/21/2012 FINAL   Final   CULTURE, BLOOD (ROUTINE X 2)     Status: Normal (Preliminary result)   Collection Time   09/11/12 10:50 PM      Component Value Range Status Comment   Specimen Description BLOOD ARM LEFT   Final    Special Requests BOTTLES DRAWN  AEROBIC AND ANAEROBIC 5CC   Final    Culture  Setup Time 09/12/2012 04:54   Final    Culture     Final    Value:        BLOOD CULTURE RECEIVED NO GROWTH TO DATE CULTURE WILL BE HELD FOR 5 DAYS BEFORE ISSUING A FINAL NEGATIVE REPORT   Report Status PENDING   Incomplete   CULTURE, BLOOD (ROUTINE X 2)     Status: Normal (Preliminary result)   Collection Time   09/11/12 11:10 PM      Component Value Range Status Comment   Specimen Description BLOOD HAND RIGHT   Final    Special Requests BOTTLES DRAWN AEROBIC AND ANAEROBIC [facility]   Final    Culture  Setup Time 09/12/2012 04:54   Final    Culture     Final    Value:        BLOOD CULTURE RECEIVED NO GROWTH TO DATE CULTURE WILL BE HELD FOR 5 DAYS BEFORE ISSUING A FINAL NEGATIVE REPORT   Report Status PENDING   Incomplete   MRSA PCR SCREENING     Status: Normal   Collection Time   09/12/12  2:23 AM      Component Value Range Status Comment    MRSA by PCR NEGATIVE  NEGATIVE Final   URINE CULTURE     Status: Normal   Collection Time   09/12/12  9:25 AM      Component Value Range Status Comment   Specimen Description URINE, CATHETERIZED   Final    Special Requests NONE   Final    Culture  Setup Time 09/12/2012 11:31   Final    Colony Count NO GROWTH   Final    Culture NO GROWTH   Final    Report Status 09/13/2012 FINAL   Final       Assessment: 66 YO F changed from vanc/zosyn to PO levaquin for LUE cellulitis and sepsis.  Her renal function has been improving and today creat 2.25 w/ creat cl ~ 30 ml/min. Afebrile. No + culture data.   Plan:  1. Levofloxacin 500 mg po x 1 dose today, then 250 mg po daily to rx cellulitis with creat cl 20-49 ml/min Herby Abraham, Pharm.D. [phone] 09/15/2012 3:23 PM

## 2012-09-15 NOTE — Progress Notes (Signed)
PULMONARY  / CRITICAL CARE MEDICINE  Name: Angie Wang MRN: [id number] DOB: 1946/10/13    LOS: 4  REFERRING MD :  Emmie Niemann Sharon Seller)   CHIEF COMPLAINT:  Hypotension   BRIEF PATIENT DESCRIPTION:  66yo female with mult medical problems admitted 11/12 with LUE cellulitis, AMS and acute renal failure with hyperkalemia.  11/13 tx to ICU r/t hypotension and PCCM consulted.    LEVEL OF CARE:  ICU PRIMARY SERVICE:  pccm CONSULTANTS:  Renal  CODE STATUS: full  DIET:  NPO DVT Px:  SQ heparin  GI Px:  protonix   LINES / TUBES: R IJ CVL 11/14 >> 11/16  CULTURES: BCx2 11/12>>> ngtd >>  Urine 11/12>> NEG  ANTIBIOTICS: Vancomycin 11/13>>> Levaquin 11/13>>      SUBJECTIVE/OVERNIGHT/INTERVAL HX No distress. No complaints. Feels ready to go home  VITAL SIGNS: Temp:  [97.7 F (36.5 C)-98.9 F (37.2 C)] 98.8 F (37.1 C) (11/16 1419) Pulse Rate:  [60-77] 76  (11/16 1419) Resp:  [13-27] 20  (11/16 1419) BP: (121-163)/(21-78) 163/78 mmHg (11/16 1419) SpO2:  [92 %-100 %] 97 % (11/16 1419) Weight:  [117.2 kg (258 lb 6.1 oz)] 117.2 kg (258 lb 6.1 oz) (11/16 0600)    INTAKE / OUTPUT: Intake/Output      11/15 0701 - 11/16 0700 11/16 0701 - 11/17 0700   P.O. 710 200   I.V. (mL/kg) 2193.9 (18.7) 400 (3.4)   Other     IV Piggyback 900    Total Intake(mL/kg) 3803.9 (32.5) 600 (5.1)   Urine (mL/kg/hr) 1715 (0.6) 425 (0.4)   Stool 0 3   Total Output 1715 428   Net +2088.9 +172        Urine Occurrence  1 x     PHYSICAL EXAMINATION: General:  NAD Neuro:  No focal deficits. HEENT:  WNL Cardiovascular:  RRR s M Lungs:  CTA bilaterally. Abdomen:  Soft, NT, ND and +BS. Musculoskeletal:  Intact. Skin: Minimal erythema on L wrist  LABS:  Lab 09/15/12 0239 09/15/12 0238 09/14/12 1805 09/14/12 1510 09/14/12 1039 09/14/12 1035 09/14/12 0430 09/13/12 1050 09/13/12 0832 09/13/12 0800 09/13/12 0519 09/12/12 1647 09/12/12 0854 09/12/12 0255 09/11/12 2258 09/11/12 1927  HGB -- -- -- --  -- -- 8.1* -- -- -- 7.7* -- -- 9.2* -- --  WBC -- -- -- -- -- -- 5.3 -- -- -- 4.0 -- -- 7.8 -- --  PLT -- -- -- -- -- -- 185 -- -- -- 154 -- -- 203 -- --  NA -- 136 -- 140 -- -- 136 -- -- -- -- -- -- -- -- --  K -- 3.7 -- 3.9 -- -- -- -- -- -- -- -- -- -- -- --  CL -- 103 -- 106 -- -- 102 -- -- -- -- -- -- -- -- --  CO2 -- 25 -- 26 -- -- 25 -- -- -- -- -- -- -- -- --  GLUCOSE -- 258* -- 193* -- -- 287* -- -- -- -- -- -- -- -- --  BUN -- 33* -- 35* -- -- 43* -- -- -- -- -- -- -- -- --  CREATININE -- 2.26* -- 2.51* -- -- 3.19* -- -- -- -- -- -- -- -- --  CALCIUM -- 7.7* -- 7.4* -- -- 7.5* -- -- -- -- -- -- -- -- --  MG -- 1.3* -- -- -- -- 1.3* -- -- -- -- -- -- -- -- --  PHOS -- 2.7 -- -- -- -- 2.6 -- -- --  4.5 -- -- -- -- --  AST -- -- -- 36 -- -- -- -- -- -- -- -- -- -- -- 32  ALT -- -- -- 28 -- -- -- -- -- -- -- -- -- -- -- 33  ALKPHOS -- -- -- 60 -- -- -- -- -- -- -- -- -- -- -- 89  BILITOT -- -- -- 0.1* -- -- -- -- -- -- -- -- -- -- -- 0.1*  PROT -- -- -- 5.2* -- -- -- -- -- -- -- -- -- -- -- 7.1  ALBUMIN -- -- -- 2.4* -- -- -- -- -- -- 2.6* 2.9* -- -- -- --  APTT -- -- -- -- -- -- -- -- -- -- -- -- -- -- -- --  INR -- -- -- -- -- -- -- -- 1.31 -- -- -- -- -- -- --  LATICACIDVEN -- -- -- -- -- -- -- 0.5 -- -- -- -- -- -- 2.9* --  TROPONINI 0.82* -- 0.97* -- 0.97* -- -- -- -- -- -- -- -- -- -- --  PROCALCITON -- 0.44 -- -- -- -- -- -- -- 0.10 -- -- -- -- -- --  PROBNP -- -- -- -- -- -- -- -- -- -- -- -- -- -- -- --  O2SATVEN -- -- -- -- -- -- -- -- -- -- -- -- -- -- -- --  PHART -- -- -- -- -- 7.416 -- -- -- -- -- -- 7.237* -- -- --  PCO2ART -- -- -- -- -- 40.9 -- -- -- -- -- -- 39.0 -- -- --  PO2ART -- -- -- -- -- 78.3* -- -- -- -- -- -- 91.7 -- -- --    Lab 09/15/12 0805 09/15/12 0406 09/15/12 0037 09/14/12 1945 09/14/12 1643  GLUCAP 203* 249* 236* 273* 200*    IMAGING: Dg Chest Port 1 View  09/14/2012  *RADIOLOGY REPORT*  Clinical Data: Respiratory failure.  PORTABLE CHEST  - 1 VIEW  Comparison: 09/13/2012  Findings: Low lung volumes cardiomegaly and vascular congestion. No overt edema.  No focal opacities or effusions.  No acute bony abnormality.  IMPRESSION: Low lung volumes.  Cardiomegaly, vascular congestion.   Original Report Authenticated By: Charlett Nose, M.D.    ECG:  DIAGNOSES:  Septic shock, resolved  Cellulitis of upper extremity  Acute renal failure - improving  Hyperkalemia, resolved  Metabolic encephalopathy, resolved  DM II (diabetes mellitus, type II), controlled  H/O HTN (hypertension)  OSA (obstructive sleep apnea)  Anemia  Metabolic acidosis, resolved     PLAN: Transfer to med surg floor [facility] to resume care 11/17 AM D/C Vanc Change Levofloxacin to PO Taper steroids to off over next couple of days  Were started in setting of septic shock D/C CVL Mobilize Change SSI to ACHS   Billy Fischer, MD ; [facility] service Mobile [phone].  After 5:30 PM or weekends, call [phone]

## 2012-09-15 NOTE — Progress Notes (Signed)
Pt refuses to wear CPAP again tonight. RT explained importance of device. Pt understands and still refuses. Pt states that she will contact RN if she changes mind. RT will monitor.

## 2012-09-15 NOTE — Progress Notes (Signed)
Report called to Mathis Fare on 6700.  Assessment unchanged since am.  Vital signs stable, MD will review transfer medications.  Pt covered on newly ordered  Sliding scale insulin coverage.  Pt voided post catheter removal.  No further needs at this time.  Harlan Stains [place] 09/15/2012

## 2012-09-16 ENCOUNTER — Inpatient Hospital Stay (HOSPITAL_COMMUNITY): Payer: Medicare Other

## 2012-09-16 LAB — GLUCOSE, CAPILLARY
Glucose-Capillary: 252 mg/dL — ABNORMAL HIGH (ref 70–99)
Glucose-Capillary: 175 mg/dL — ABNORMAL HIGH (ref 70–99)
Glucose-Capillary: 171 mg/dL — ABNORMAL HIGH (ref 70–99)

## 2012-09-16 LAB — BASIC METABOLIC PANEL
BUN: 27 mg/dL — ABNORMAL HIGH (ref 6–23)
CO2: 28 mEq/L (ref 19–32)
Chloride: 107 mEq/L (ref 96–112)
Creatinine, Ser: 1.69 mg/dL — ABNORMAL HIGH (ref 0.50–1.10)
GFR calc Af Amer: 36 mL/min — ABNORMAL LOW (ref >90)
Potassium: 3.4 mEq/L — ABNORMAL LOW (ref 3.5–5.1)

## 2012-09-16 MED ORDER — CEPHALEXIN 500 MG PO CAPS
500.0000 mg | ORAL_CAPSULE | Freq: Two times a day (BID) | ORAL | Status: DC
  Administered 2012-09-16 – 2012-09-17 (×3): 500 mg via ORAL
  Filled 2012-09-16 (×5): qty 1

## 2012-09-16 MED ORDER — PREDNISONE 20 MG PO TABS
40.0000 mg | ORAL_TABLET | Freq: Every day | ORAL | Status: DC
  Administered 2012-09-17: 40 mg via ORAL
  Filled 2012-09-16 (×2): qty 2

## 2012-09-16 MED ORDER — INSULIN ASPART 100 UNIT/ML ~~LOC~~ SOLN
0.0000 [IU] | Freq: Three times a day (TID) | SUBCUTANEOUS | Status: DC
  Administered 2012-09-16 (×2): 4 [IU] via SUBCUTANEOUS
  Administered 2012-09-17: 3 [IU] via SUBCUTANEOUS
  Administered 2012-09-17: 11 [IU] via SUBCUTANEOUS

## 2012-09-16 MED ORDER — AMLODIPINE BESYLATE 5 MG PO TABS
5.0000 mg | ORAL_TABLET | Freq: Every day | ORAL | Status: DC
  Administered 2012-09-16 – 2012-09-17 (×2): 5 mg via ORAL
  Filled 2012-09-16 (×2): qty 1

## 2012-09-16 MED ORDER — INSULIN DETEMIR 100 UNIT/ML ~~LOC~~ SOLN
45.0000 [IU] | Freq: Every day | SUBCUTANEOUS | Status: DC
  Administered 2012-09-16: 45 [IU] via SUBCUTANEOUS
  Filled 2012-09-16: qty 10

## 2012-09-16 MED ORDER — ALBUTEROL SULFATE (5 MG/ML) 0.5% IN NEBU: 2.5000 mg | INHALATION_SOLUTION | Freq: Once | RESPIRATORY_TRACT | Status: DC

## 2012-09-16 NOTE — Progress Notes (Addendum)
[facility] PROGRESS NOTE  Angie Wang ZOX:[id number] DOB: 1946/01/27 DOA: 09/11/2012 PCP: Gabriel Cirri, DO  Assessment/Plan: Sepsis -Secondary to left upper extremity cellulitis -Clinically improved -Wean steroids--discontinue Solu-Cortef -Discontinue Levaquin -Start cephalexin -Wound cultures on 08/19/2012 showed group A streptococcus Acute on chronic renal failure -Serum creatinine continues to improve -Likely due to a combination of medications(Bactrim, ACE inhibitor, metformin, diuretic), ATN, hypotension Acute encephalopathy -Clinically improved Diabetes mellitus type 2 -Continue Levemir 40 units at bedtime -Tighten NovoLog sliding scale -Hold metformin and Amaryl for now -Patient was on Levemir 50 units at the time of last discharge Hypertension -Hold lisinopril for now due to renal failure -Start low-dose diltiazem Elevated troponin -Likely due to demand ischemia from sepsis and acute renal failure -Doubt acute coronary syndrome Dyspnea -albuterol prn -refuses to wear CPAP for OSA     Disposition Plan:   Home when medically stable    Antibiotics:  Vancomycin November 13>>> November 16  Zosyn November 13>>> November 16  Levofloxacin 750 mg times one November 13  Levofloxacin 500 mg November 16  Cephalexin November 17>>>>   Procedures/Studies: Dg Chest 2 View  09/11/2012  *RADIOLOGY REPORT*  Clinical Data: Altered mental status  CHEST - 2 VIEW  Comparison: 08/20/2012  Findings: Mild cardiac enlargement.  No pleural effusion or edema identified.  No airspace consolidation identified.  Lung volumes appear low.  IMPRESSION:  1.  No acute cardiopulmonary abnormalities.   Original Report Authenticated By: Signa Kell, M.D.    Ct Head Wo Contrast  09/11/2012  *RADIOLOGY REPORT*  Clinical Data: Altered mental status.  CT HEAD WITHOUT CONTRAST  Technique:  Contiguous axial images were obtained from the base of the skull through the vertex without  contrast.  Comparison: 08/18/2012  Findings: No acute intracranial abnormality.  Specifically, no hemorrhage, hydrocephalus, mass lesion, acute infarction, or significant intracranial injury.  No acute calvarial abnormality. Visualized paranasal sinuses and mastoids clear.  Orbital soft tissues unremarkable.  IMPRESSION: No acute intracranial abnormality.   Original Report Authenticated By: Charlett Nose, M.D.    Ct Head Wo Contrast  08/18/2012  *RADIOLOGY REPORT*  Clinical Data:  Multiple falls, altered mental status  CT HEAD WITHOUT CONTRAST CT CERVICAL SPINE WITHOUT CONTRAST  Technique:  Multidetector CT imaging of the head and cervical spine was performed following the standard protocol without intravenous contrast.  Multiplanar CT image reconstructions of the cervical spine were also generated.  Comparison:  MRI brain dated 09/15/2009  CT HEAD  Findings: Severely motion degraded images.  No evidence of parenchymal hemorrhage or extra-axial fluid collection. No mass lesion, mass effect, or midline shift.  No CT evidence of acute infarction. Subcortical white matter and periventricular small vessel ischemic changes.  Global cortical atrophy.  No ventriculomegaly.  The visualized paranasal sinuses are essentially clear. The mastoid air cells are unopacified.  No evidence of calvarial fracture.  IMPRESSION: Severely motion degraded images.  No evidence of acute intracranial abnormality.  Atrophy with small vessel ischemic changes and intracranial atherosclerosis.  CT CERVICAL SPINE  Findings: Motion degraded images.  Straightening of the cervical spine, possibly positional.  No evidence of fracture or dislocation.  The vertebral body heights are maintained.  Dens appears intact.  No prevertebral soft tissue swelling.  Mild multilevel degenerative changes.  Visualized right thyroid is enlarged/nodular.  Visualized lung apices are essentially clear.  IMPRESSION: No evidence of traumatic injury to the cervical  spine.  Mild multilevel degenerative changes.   Original Report Authenticated By: Charline Bills, M.D.    Ct Cervical  Spine Wo Contrast  08/18/2012  *RADIOLOGY REPORT*  Clinical Data:  Multiple falls, altered mental status  CT HEAD WITHOUT CONTRAST CT CERVICAL SPINE WITHOUT CONTRAST  Technique:  Multidetector CT imaging of the head and cervical spine was performed following the standard protocol without intravenous contrast.  Multiplanar CT image reconstructions of the cervical spine were also generated.  Comparison:  MRI brain dated 09/15/2009  CT HEAD  Findings: Severely motion degraded images.  No evidence of parenchymal hemorrhage or extra-axial fluid collection. No mass lesion, mass effect, or midline shift.  No CT evidence of acute infarction. Subcortical white matter and periventricular small vessel ischemic changes.  Global cortical atrophy.  No ventriculomegaly.  The visualized paranasal sinuses are essentially clear. The mastoid air cells are unopacified.  No evidence of calvarial fracture.  IMPRESSION: Severely motion degraded images.  No evidence of acute intracranial abnormality.  Atrophy with small vessel ischemic changes and intracranial atherosclerosis.  CT CERVICAL SPINE  Findings: Motion degraded images.  Straightening of the cervical spine, possibly positional.  No evidence of fracture or dislocation.  The vertebral body heights are maintained.  Dens appears intact.  No prevertebral soft tissue swelling.  Mild multilevel degenerative changes.  Visualized right thyroid is enlarged/nodular.  Visualized lung apices are essentially clear.  IMPRESSION: No evidence of traumatic injury to the cervical spine.  Mild multilevel degenerative changes.   Original Report Authenticated By: Charline Bills, M.D.    US Renal  09/12/2012  *RADIOLOGY REPORT*  Clinical Data: Acute renal failure, diabetes, hypertension  RENAL/URINARY TRACT ULTRASOUND COMPLETE  Comparison:  Ultrasound of the kidneys of  12/14/2011  Findings:  Right Kidney:  No hydronephrosis is seen.  The right kidney measures 12.3 cm sagittally.  The parenchyma of the right kidney does appear to be echogenic consistent with chronic renal medical disease.  Left Kidney:  No hydronephrosis is noted.  The left kidney measures 12.2 cm.  The left renal parenchyma also appears echogenic.  Bladder:  The urinary bladder is decompressed with Foley catheter present.  An incidental right adnexal cyst is noted of 5.0 x 4.4 cm.  Pelvic ultrasound may be helpful if further assessment is warranted.  IMPRESSION:  1.  No hydronephrosis.  Somewhat echogenic renal parenchyma consistent with chronic renal medical disease. 2.  5 cm right adnexal cystic structure.  Consider pelvic ultrasound to assess further.   Original Report Authenticated By: Dwyane Dee, M.D.    Dg Chest Port 1 View  09/14/2012  *RADIOLOGY REPORT*  Clinical Data: Respiratory failure.  PORTABLE CHEST - 1 VIEW  Comparison: 09/13/2012  Findings: Low lung volumes cardiomegaly and vascular congestion. No overt edema.  No focal opacities or effusions.  No acute bony abnormality.  IMPRESSION: Low lung volumes.  Cardiomegaly, vascular congestion.   Original Report Authenticated By: Charlett Nose, M.D.    Dg Chest Port 1 View  09/13/2012  *RADIOLOGY REPORT*  Clinical Data: Status post line insertion.  PORTABLE CHEST - 1 VIEW  Comparison: Chest x-ray 09/11/2012.  Findings: Lung volumes are low.  Right internal jugular central venous catheter with tip terminating at the superior cavoatrial junction.  No pneumothorax.  No consolidative airspace disease.  No pleural effusions.  Pulmonary vasculature is normal.  Heart size is upper limits of normal. The patient is rotated to the left on today's exam, resulting in distortion of the mediastinal contours and reduced diagnostic sensitivity and specificity for mediastinal pathology.  Atherosclerosis in the thoracic aorta.  IMPRESSION: 1.  Right IJ central venous  catheter appears properly located.  No pneumothorax. 2.  Low lung volumes without radiographic evidence of acute cardiopulmonary disease. 3.  Atherosclerosis.   Original Report Authenticated By: Trudie Reed, M.D.    Dg Chest Port 1 View  08/20/2012  *RADIOLOGY REPORT*  Clinical Data: Short of breath, weakness, pulmonary edema  PORTABLE CHEST - 1 VIEW  Comparison: 08/18/2012  Findings: Low lung volumes.  Lungs are essentially clear.  No pleural effusion or pneumothorax.  The heart is top normal in size.  IMPRESSION: No evidence of acute cardiopulmonary disease.  Low lung volumes.   Original Report Authenticated By: Charline Bills, M.D.    Dg Chest Port 1 View  08/18/2012  *RADIOLOGY REPORT*  Clinical Data: Altered mental status.  Fall.  PORTABLE CHEST - 1 VIEW  Comparison: 01/31/2009  Findings: Shallow inspiration.  Normal heart size and pulmonary vascularity for technique.  No focal airspace consolidation in the lungs.  No blunting of costophrenic angles.  No pneumothorax.  No significant changes since the previous study.  IMPRESSION: No evidence of active disease.   Original Report Authenticated By: Marlon Pel, M.D.    Dg Hand Complete Left  08/19/2012  *RADIOLOGY REPORT*  Clinical Data: Pain and swelling.  Skin tear.  Post chills in the hand and wrist.  LEFT HAND - COMPLETE 3+ VIEW  Comparison: None.  Findings: Degenerative changes in the interphalangeal, first metacarpal phalangeal, first carpometacarpal, and STT joints. Postoperative changes with plate screw fixation of the distal radial metaphysis and old ununited ulnar styloid process.  No acute fracture or subluxation.  No focal bone lesion or bone destruction. Diffuse soft tissue swelling over the carpal area.  No radiopaque soft tissue foreign bodies.  IMPRESSION: Degenerative changes in the hand and wrist.  Postoperative changes in the wrist.  Soft tissue swelling.  No acute bony abnormalities.   Original Report Authenticated By:  Marlon Pel, M.D.          Subjective: Patient complains of shortness of breath that started Friday. She states that it is improving since that period of time. She has an intermittent dry cough. Denies any chest pain. Denies fevers, chills, nausea, vomiting, diarrhea, abdominal pain, dysuria. She has 3-4 loose bowel movements normally. Patient states that she has irritable bowel syndrome. Denies any hematochezia or melena.  Objective: Filed Vitals:   09/15/12 1656 09/15/12 2159 09/16/12 0047 09/16/12 0545  BP: 168/91 166/68  169/61  Pulse: 75 74 72 69  Temp: 98.5 F (36.9 C) 98.4 F (36.9 C)  98.4 F (36.9 C)  TempSrc: Oral Oral  Oral  Resp: 20 22 20 22   Height:  5\' 2"  (1.575 m)    Weight:  115.7 kg (255 lb 1.2 oz)    SpO2: 93% 92% 94% 93%    Intake/Output Summary (Last 24 hours) at 09/16/12 1022 Last data filed at 09/16/12 0700  Gross per 24 hour  Intake    340 ml  Output    427 ml  Net    -87 ml   Weight change: -1.5 kg (-3 lb 4.9 oz) Exam:   General:  Pt is alert, follows commands appropriately, not in acute distress  HEENT: No icterus, No thrush,  [place]/AT  Cardiovascular: RRR, S1/S2, no rubs, no gallops  Respiratory: Fine right basilar crackle. Left clear to auscultation. No wheezes or rhonchi. Good air movement.  Abdomen: Soft/+BS, non tender, non distended, no guarding  Extremities: Left upper extremity with healing scabs. Mild erythema on the dorsum of the  hand and wrist without lymphangitis, draining wound, necrosis, crepitance. Right upper extremity with scattered ecchymoses. Trace edema bilateral lower extremities.  Data Reviewed: Basic Metabolic Panel:  Lab 09/16/12 1610 09/15/12 0238 09/14/12 1510 09/14/12 0430 09/13/12 1810 09/13/12 0519 09/12/12 1647 09/12/12 0938  NA 142 136 140 136 137 -- -- --  K 3.4* 3.7 3.9 4.2 4.8 -- -- --  CL 107 103 106 102 106 -- -- --  CO2 28 25 26 25 22  -- -- --  GLUCOSE 165* 258* 193* 287* 336* -- -- --  BUN  27* 33* 35* 43* 50* -- -- --  CREATININE 1.69* 2.26* 2.51* 3.19* 3.84* -- -- --  CALCIUM 8.5 7.7* 7.4* 7.5* 7.3* -- -- --  MG -- 1.3* -- 1.3* -- -- -- --  PHOS -- 2.7 -- 2.6 -- 4.5 5.7* 6.0*   Liver Function Tests:  Lab 09/14/12 1510 09/13/12 0519 09/12/12 1647 09/12/12 0938 09/11/12 1927  AST 36 -- -- -- 32  ALT 28 -- -- -- 33  ALKPHOS 60 -- -- -- 89  BILITOT 0.1* -- -- -- 0.1*  PROT 5.2* -- -- -- 7.1  ALBUMIN 2.4* 2.6* 2.9* 2.9* 3.6   No results found for this basename: LIPASE:5,AMYLASE:5 in the last 168 hours No results found for this basename: AMMONIA:5 in the last 168 hours CBC:  Lab 09/14/12 0430 09/13/12 0519 09/12/12 0255 09/11/12 1927  WBC 5.3 4.0 7.8 8.8  NEUTROABS -- -- -- --  HGB 8.1* 7.7* 9.2* 9.8*  HCT 23.9* 23.5* 28.4* 30.0*  MCV 89.5 90.7 92.8 91.5  PLT 185 154 203 230   Cardiac Enzymes:  Lab 09/15/12 0239 09/14/12 1805 09/14/12 1039 09/13/12 2208 09/13/12 1651 09/12/12 0938  CKTOTAL -- -- -- -- -- 139  CKMB -- -- -- -- -- --  CKMBINDEX -- -- -- -- -- --  TROPONINI 0.82* 0.97* 0.97* 1.19* 0.97* --   BNP: No components found with this basename: POCBNP:5 CBG:  Lab 09/16/12 0736 09/15/12 2154 09/15/12 1700 09/15/12 1252 09/15/12 0805  GLUCAP 115* 174* 222* 252* 203*    Recent Results (from the past 240 hour(s))  CULTURE, BLOOD (ROUTINE X 2)     Status: Normal (Preliminary result)   Collection Time   09/11/12 10:50 PM      Component Value Range Status Comment   Specimen Description BLOOD ARM LEFT   Final    Special Requests BOTTLES DRAWN AEROBIC AND ANAEROBIC 5CC   Final    Culture  Setup Time 09/12/2012 04:54   Final    Culture     Final    Value:        BLOOD CULTURE RECEIVED NO GROWTH TO DATE CULTURE WILL BE HELD FOR 5 DAYS BEFORE ISSUING A FINAL NEGATIVE REPORT   Report Status PENDING   Incomplete   CULTURE, BLOOD (ROUTINE X 2)     Status: Normal (Preliminary result)   Collection Time   09/11/12 11:10 PM      Component Value Range Status  Comment   Specimen Description BLOOD HAND RIGHT   Final    Special Requests BOTTLES DRAWN AEROBIC AND ANAEROBIC [facility]   Final    Culture  Setup Time 09/12/2012 04:54   Final    Culture     Final    Value:        BLOOD CULTURE RECEIVED NO GROWTH TO DATE CULTURE WILL BE HELD FOR 5 DAYS BEFORE ISSUING A FINAL NEGATIVE REPORT   Report Status PENDING  Incomplete   MRSA PCR SCREENING     Status: Normal   Collection Time   09/12/12  2:23 AM      Component Value Range Status Comment   MRSA by PCR NEGATIVE  NEGATIVE Final   URINE CULTURE     Status: Normal   Collection Time   09/12/12  9:25 AM      Component Value Range Status Comment   Specimen Description URINE, CATHETERIZED   Final    Special Requests NONE   Final    Culture  Setup Time 09/12/2012 11:31   Final    [place] Count NO GROWTH   Final    Culture NO GROWTH   Final    Report Status 09/13/2012 FINAL   Final      Scheduled Meds:   . aspirin EC  81 mg Oral Daily  . cephALEXin  500 mg Oral Q12H  . folic acid-pyridoxine-cyancobalamin  1 tablet Oral Daily  . heparin  5,000 Units Subcutaneous Q8H  . insulin aspart  0-20 Units Subcutaneous TID WC  . insulin aspart  0-5 Units Subcutaneous QHS  . insulin detemir  40 Units Subcutaneous QHS  . pantoprazole  40 mg Oral Daily  . PARoxetine  40 mg Oral Daily  . sodium chloride  3 mL Intravenous Q12H  . Vitamin D (Ergocalciferol)  50,000 Units Oral Q7 days  . [DISCONTINUED] hydrocortisone sod succinate (SOLU-CORTEF) injection  50 mg Intravenous Q6H  . [DISCONTINUED] hydrocortisone sod succinate (SOLU-CORTEF) injection  50 mg Intravenous Q12H  . [DISCONTINUED] insulin aspart  0-15 Units Subcutaneous TID WC  . [DISCONTINUED] insulin aspart  0-9 Units Subcutaneous Q4H  . [DISCONTINUED] levofloxacin  250 mg Oral Daily  . [DISCONTINUED] levofloxacin  500 mg Oral Daily  . [DISCONTINUED] piperacillin-tazobactam (ZOSYN)  IV  3.375 g Intravenous Q8H  . [DISCONTINUED] vancomycin  1,500 mg  Intravenous Q48H   Continuous Infusions:   . [DISCONTINUED] sodium chloride 100 mL/hr at 09/15/12 0743  . [DISCONTINUED] norepinephrine (LEVOPHED) Adult infusion Stopped (09/14/12 0845)     Chamaine Stankus, DO  [facility] Pager [phone]  If 7PM-7AM, please contact night-coverage [email or web address] Password [email or web address] 09/16/2012, 10:22 AM   LOS: 5 days

## 2012-09-16 NOTE — Progress Notes (Signed)
Patient refuses cpap at this time, but requested that she wear 2lpm nasal cannula.  I placed patient on 2lpm nasal cannula QHS

## 2012-09-17 DIAGNOSIS — E860 Dehydration: Secondary | ICD-10-CM

## 2012-09-17 LAB — BASIC METABOLIC PANEL
Calcium: 8.4 mg/dL (ref 8.4–10.5)
Creatinine, Ser: 1.43 mg/dL — ABNORMAL HIGH (ref 0.50–1.10)
GFR calc Af Amer: 44 mL/min — ABNORMAL LOW (ref >90)
GFR calc non Af Amer: 38 mL/min — ABNORMAL LOW (ref >90)
Sodium: 141 mEq/L (ref 135–145)

## 2012-09-17 LAB — GLUCOSE, CAPILLARY
Glucose-Capillary: 89 mg/dL (ref 70–99)
Glucose-Capillary: 144 mg/dL — ABNORMAL HIGH (ref 70–99)

## 2012-09-17 LAB — CBC
Platelets: 198 10*3/uL (ref 150–400)
RBC: 2.69 MIL/uL — ABNORMAL LOW (ref 3.87–5.11)
RDW: 14.9 % (ref 11.5–15.5)
WBC: 7.2 10*3/uL (ref 4.0–10.5)

## 2012-09-17 LAB — PROCALCITONIN: Procalcitonin: 0.15 ng/mL

## 2012-09-17 MED ORDER — PREDNISONE 10 MG PO TABS: ORAL_TABLET | ORAL | Status: AC

## 2012-09-17 MED ORDER — AMLODIPINE BESYLATE 10 MG PO TABS: 5.0000 mg | ORAL_TABLET | Freq: Every day | ORAL | Status: AC

## 2012-09-17 MED ORDER — CEPHALEXIN 500 MG PO CAPS: 500.0000 mg | ORAL_CAPSULE | Freq: Three times a day (TID) | ORAL | Status: AC

## 2012-09-17 NOTE — Progress Notes (Signed)
Pt. Got d/c instructions and prescription,IV was removed.pt. Ready to go home.

## 2012-09-17 NOTE — Evaluation (Signed)
Physical Therapy Evaluation Patient Details Name: Angie Wang MRN: [id number] DOB: 06-Aug-1946 Today's Date: 09/17/2012 Time: 4098-1191 PT Time Calculation (min): 12 min  PT Assessment / Plan / Recommendation Clinical Impression  Pt adm with cellulitis and acute renal failure.  Pt moving well but fatigues with longer distance.  Pt with history of falls and will always be a fall risk. Has lifeline at home.  Recommend home with HHPT.    PT Assessment  Patient needs continued PT services    Follow Up Recommendations  Home health PT    Does the patient have the potential to tolerate intense rehabilitation      Barriers to Discharge Decreased caregiver support      Equipment Recommendations  None recommended by PT    Recommendations for Other Services     Frequency Min 3X/week    Precautions / Restrictions     Pertinent Vitals/Pain HR 135 with amb      Mobility  Bed Mobility Supine to Sit: 6: Modified independent (Device/Increase time);HOB elevated Sitting - Scoot to Edge of Bed: 6: Modified independent (Device/Increase time) Transfers Sit to Stand: 6: Modified independent (Device/Increase time) Stand to Sit: 6: Modified independent (Device/Increase time) Ambulation/Gait Ambulation/Gait Assistance: 6: Modified independent (Device/Increase time);5: Supervision Ambulation Distance (Feet): 175 Feet Assistive device: Rolling walker Ambulation/Gait Assistance Details: Pt fatigues with incr distance. Gait Pattern: Step-through pattern;Decreased stride length    Shoulder Instructions     Exercises     PT Diagnosis: Difficulty walking  PT Problem List: Decreased balance;Decreased activity tolerance PT Treatment Interventions:     PT Goals Acute Rehab PT Goals PT Goal Formulation: With patient Pt will Ambulate: >150 feet;with rolling walker;with modified independence PT Goal: Ambulate - Progress: Goal set today Additional Goals Additional Goal #1: Pt will perform  standing balance exercises with supervision. PT Goal: Additional Goal #1 - Progress: Goal set today  Visit Information  Last PT Received On: 09/17/12 Assistance Needed: +1    Subjective Data  Subjective: Pt states she only stayed home two days. Patient Stated Goal: Return home   Prior Functioning  Home Living Lives With: Alone Available Help at Discharge: Family;Available PRN/intermittently Type of Home: Apartment Home Access: Ramped entrance Home Layout: One level Bathroom Shower/Tub: Engineer, manufacturing systems: Handicapped height How Accessible: Accessible via walker Home Adaptive Equipment: Shower chair with back;Grab bars in shower;Walker - rolling;Straight cane;Hospital bed;Other (comment) (lift chair) Prior Function Level of Independence: Independent with assistive device(s) Driving: Yes Vocation: On disability Communication Communication: No difficulties    Cognition  Overall Cognitive Status: Appears within functional limits for tasks assessed/performed Arousal/Alertness: Awake/alert Orientation Level: Appears intact for tasks assessed Behavior During Session: [facility] for tasks performed    Extremity/Trunk Assessment Right Lower Extremity Assessment RLE ROM/Strength/Tone: [facility] for tasks assessed Left Lower Extremity Assessment LLE ROM/Strength/Tone: [facility] for tasks assessed   Balance Static Standing Balance Static Standing - Balance Support: Bilateral upper extremity supported (on walker) Static Standing - Level of Assistance: 6: Modified independent (Device/Increase time)  End of Session PT - End of Session Activity Tolerance: Patient tolerated treatment well Patient left: in bed;with call bell/phone within reach Nurse Communication: Mobility status  GP     [facility] 09/17/2012, 9:26 AM  Skip Mayer PT [phone]

## 2012-09-17 NOTE — Discharge Summary (Signed)
Physician Discharge Summary  Angie Wang ZOX:[id number] DOB: [birth year] DOA: 09/11/2012  PCP: Gabriel Cirri, DO  Admit date: 09/11/2012 Discharge date: 09/17/2012  Recommendations for Outpatient Follow-up:  1. Pt will need to follow up with PCP in 2 weeks post discharge 2. Do not restart lisinopril , HCTZ, metformin, amaryl until follow up with Primary care physician and BMP recheck.  Discharge Diagnoses:  Principal Problem:  *Acute renal failure Active Problems:  Metabolic encephalopathy  DM II (diabetes mellitus, type II), controlled  HTN (hypertension)  OSA (obstructive sleep apnea)  Cellulitis of upper extremity  Hyperkalemia  Hypotension  Dehydration  ATN (acute tubular necrosis)  Anemia  Metabolic acidosis  Adnexal cyst-right  Septic shock Sepsis  -Secondary to left upper extremity cellulitis  -Clinically improved  -Wean steroids--discontinue Solu-Cortef--3 more days prednisone at home -Discontinue Levaquin  -Start cephalexin x 4 more days -Wound cultures on 08/19/2012 showed group A streptococcus  Acute on chronic renal failure  -Serum creatinine continues to improve  -Likely due to a combination of medications(Bactrim, ACE inhibitor, metformin, diuretic), ATN, hypotension  -Do not restart ACE inhibitor, hydrochlorothiazide, or metformin, Amaryl until patient follows up with blood work and her primary care provider Acute encephalopathy  -Clinically improved  Diabetes mellitus type 2  -Continue Levemir 50 units at bedtime at home -Tighten NovoLog sliding scale  -Hold metformin and Amaryl for now  Hypertension  -Hold lisinopril for now due to renal failure  -Start low-dose amlodipine  Elevated troponin  -Likely due to demand ischemia from sepsis and acute renal failure  -Doubt acute coronary syndrome  Dyspnea  -albuterol prn  -refuses to wear CPAP for OSA   Discharge Condition: Stable  Disposition:  discharge home  Diet: Carbohydrate modified Wt  Readings from Last 3 Encounters:  09/16/12 115.985 kg (255 lb 11.2 oz)  08/24/12 115.667 kg (255 lb)    History of present illness:  66 year old female patient with multiple medical problems who lives at home alone. She has a nephew and a brother who check on her frequently. She was brought to the emergency department because of increasing confusion. In addition she has cellulitis of the left upper extremity and has been on Bactrim. Evaluation in the ER demonstrated acute renal failure with a creatinine of 6 and a BUN of 68 as well as hyperkalemia. She was empirically treated with calcium, D50 and insulin as well as Kayexalate for hyperkalemia. She was profoundly lethargic and confused on admission and it was felt this is related to uremia so nephrology was consulted. Chest x-ray revealed no evidence of infiltrate. Her white count was normal. Her hemoglobin is 9.8. She was subsequently admitted to the step down unit for further monitoring and treatment .Patient was recently hospitalized in mid October for the cellulitis of the upper extremity. She was also treated for acute renal failure and during the hospitalization her HCTZ and ACE inhibitor were held.    Hospital Course:  The patient was started on vancomycin and Zosyn empirically. Blood cultures were drawn as well as urine cultures. Renal ultrasound was obtained on 09/12/2012 which was negative for any hydronephrosis. Nephrology was consulted. They did not feel that there was any acute indication for dialysis. They recommended close followup of her renal function with hydration and avoiding any nephrotoxic agents. The patient was placed on a bicarbonate drip which was subsequently discontinued after the patient's renal function improved and her metabolic acidosis improved. Unfortunately, the patient developed hypotension on 09/13/2012. The patient was transferred to the intensive  care unit where she was started on dopamine. This was transitioned to  Levaquin. She was subsequently weaned off of Levophed. The patient was started on stress steroids, Solu-Cortef. This was subsequently weaned to prednisone. The patient will continue to finish the wean as an outpatient. During hospitalization, Bactrim, Prinivil, hydrochlorothiazide, metformin, and NSAIDs were discontinued. The patient was subsequently weaned off of Levophed on 09/14/2012. The patient's mentation gradually improved. Her renal function gradually improved with intravenous hydration and antibiotic support. Ultrasound of her right upper extremity did not reveal any deep vein thrombosis. The patient's mentation continued to improve as her sepsis and renal function improved. At the time of discharge, the patient was back to her baseline mentation. The patient was subsequently moved out to the regular medical floor on 09/15/2012. Her vancomycin and Zosyn were discontinued. The patient will start on cephalexin for her cellulitis. The patient had a previous culture on 08/19/2012 from the left upper extremity growing group A streptococcus. The patient once again became hypertensive off of her antihypertensive medications. As the patient continued to have some renal insufficiency, a decision was made to start the patient on amlodipine and not restart her Prinivil or HCTZ. The patient was given instructions not to restart her ACE inhibitor or hydrochlorothiazide until she follows up with blood work and her primary care provider in 2 weeks. Of note, the patient had an MRSA PCR screen which was negative. An echocardiogram on 08/19/2012 revealed ejection fraction 55% and grade 1 diastolic dysfunction. For her diabetes, the patient was told to hold off on her metformin until she follows up with her primary care provider. The patient was restarted on her Levemir which was gradually titrated up to 45 units at bedtime. She was also told to hold off on her Amaryl until she follows up with her primary care  physician.  Consultants: Dr. Arrie Aran  Discharge Exam: Filed Vitals:   09/17/12 1656  BP: 168/71  Pulse: 83  Temp: 98.2 F (36.8 C)  Resp: 20   Filed Vitals:   09/17/12 0900 09/17/12 1215 09/17/12 1409 09/17/12 1656  BP:  179/88 155/74 168/71  Pulse: 135 85 89 83  Temp:  98.8 F (37.1 C) 98.9 F (37.2 C) 98.2 F (36.8 C)  TempSrc:  Oral Oral   Resp:  18 20 20   Height:      Weight:      SpO2: 94% 99% 100% 98%   General: A&O x 3, NAD, pleasant, cooperative Cardiovascular: RRR, no rub, no gallop, no S3 Respiratory: CTAB, no wheeze, no rhonchi Abdomen:soft, nontender, nondistended, positive bowel sounds Extremities: No edema, No lymphangitis, no petechiae  Discharge Instructions      Discharge Orders    Future Orders Please Complete By Expires   Diet - low sodium heart healthy      Increase activity slowly      Discharge instructions      Comments:   Prednisone 30mg  (3 tabs) on 09/18/12, 20mg  (2 tabs) on 09/19/12, 10mg  (1 tab) on 09/20/12 Stop Bactrim Stop Prinizide until you follow up with your primary care doctor Stop glucophage and amaryl until you follow up with your primary care doctor Cephalexin 500mg  three times a day x 4 days       Medication List     As of 09/17/2012  5:43 PM    STOP taking these medications         celecoxib 200 MG capsule   Commonly known as: CELEBREX      lisinopril-hydrochlorothiazide  20-25 MG per tablet   Commonly known as: PRINZIDE,ZESTORETIC      metFORMIN 1000 MG tablet   Commonly known as: GLUCOPHAGE      sulfamethoxazole-trimethoprim 800-160 MG per tablet   Commonly known as: BACTRIM DS      TAKE these medications         acetaminophen 500 MG tablet   Commonly known as: TYLENOL   Take 500 mg by mouth every 6 (six) hours as needed. For pain      albuterol 108 (90 BASE) MCG/ACT inhaler   Commonly known as: PROVENTIL HFA;VENTOLIN HFA   Inhale 2 puffs into the lungs every 6 (six) hours as needed. For  shortness of breath      amitriptyline 25 MG tablet   Commonly known as: ELAVIL   Take 25 mg by mouth at bedtime.      amLODipine 10 MG tablet   Commonly known as: NORVASC   Take 0.5 tablets (5 mg total) by mouth daily.      cephALEXin 500 MG capsule   Commonly known as: KEFLEX   Take 1 capsule (500 mg total) by mouth 3 (three) times daily.      esomeprazole 40 MG capsule   Commonly known as: NEXIUM   Take 40 mg by mouth daily before breakfast.      Folic Acid-Vit B6-Vit B12 2.5-25-1 MG Tabs   Commonly known as: FOLBEE   Take 1 tablet by mouth daily.      gabapentin 600 MG tablet   Commonly known as: NEURONTIN   Take 600 mg by mouth 2 (two) times daily.      glimepiride 4 MG tablet   Commonly known as: AMARYL   Take 4 mg by mouth 2 (two) times daily.      insulin aspart 100 UNIT/ML injection   Commonly known as: novoLOG   Inject 0-20 Units into the skin 3 (three) times daily with meals.      insulin detemir 100 UNIT/ML injection   Commonly known as: LEVEMIR   Inject 50 Units into the skin at bedtime.      PARoxetine 40 MG tablet   Commonly known as: PAXIL   Take 40 mg by mouth every morning.      predniSONE 10 MG tablet   Commonly known as: DELTASONE   Take 3 tablets 09/18/12 and decrease by one each day until gone      traMADol 50 MG tablet   Commonly known as: ULTRAM   Take 50 mg by mouth every 8 (eight) hours as needed. For pain      VICTOZA 18 MG/3ML Soln   Generic drug: Liraglutide   Inject 18 mg into the skin every morning.      Vitamin D (Ergocalciferol) 50000 UNITS Caps   Commonly known as: DRISDOL   Take 50,000 Units by mouth every 7 (seven) days. On Friday           The results of significant diagnostics from this hospitalization (including imaging, microbiology, ancillary and laboratory) are listed below for reference.    Significant Diagnostic Studies: Dg Chest 2 View  09/16/2012  *RADIOLOGY REPORT*  Clinical Data: Short of breath.   Cough.  CHEST - 2 VIEW  Comparison: 09/14/2012.  Findings: Low volume chest.  Basilar atelectasis.  Small pleural effusion identified on the lateral view, small bilateral pleural effusions on the lateral view.  No focal consolidation. Cardiopericardial silhouette is within normal limits for projection.  IMPRESSION: Small bilateral pleural effusions  and low volume chest with basilar atelectasis.   Original Report Authenticated By: Andreas Newport, M.D.    Dg Chest 2 View  09/11/2012  *RADIOLOGY REPORT*  Clinical Data: Altered mental status  CHEST - 2 VIEW  Comparison: 08/20/2012  Findings: Mild cardiac enlargement.  No pleural effusion or edema identified.  No airspace consolidation identified.  Lung volumes appear low.  IMPRESSION:  1.  No acute cardiopulmonary abnormalities.   Original Report Authenticated By: Signa Kell, M.D.    Ct Head Wo Contrast  09/11/2012  *RADIOLOGY REPORT*  Clinical Data: Altered mental status.  CT HEAD WITHOUT CONTRAST  Technique:  Contiguous axial images were obtained from the base of the skull through the vertex without contrast.  Comparison: 08/18/2012  Findings: No acute intracranial abnormality.  Specifically, no hemorrhage, hydrocephalus, mass lesion, acute infarction, or significant intracranial injury.  No acute calvarial abnormality. Visualized paranasal sinuses and mastoids clear.  Orbital soft tissues unremarkable.  IMPRESSION: No acute intracranial abnormality.   Original Report Authenticated By: Charlett Nose, M.D.    Ct Head Wo Contrast  08/18/2012  *RADIOLOGY REPORT*  Clinical Data:  Multiple falls, altered mental status  CT HEAD WITHOUT CONTRAST CT CERVICAL SPINE WITHOUT CONTRAST  Technique:  Multidetector CT imaging of the head and cervical spine was performed following the standard protocol without intravenous contrast.  Multiplanar CT image reconstructions of the cervical spine were also generated.  Comparison:  MRI brain dated 09/15/2009  CT HEAD  Findings:  Severely motion degraded images.  No evidence of parenchymal hemorrhage or extra-axial fluid collection. No mass lesion, mass effect, or midline shift.  No CT evidence of acute infarction. Subcortical white matter and periventricular small vessel ischemic changes.  Global cortical atrophy.  No ventriculomegaly.  The visualized paranasal sinuses are essentially clear. The mastoid air cells are unopacified.  No evidence of calvarial fracture.  IMPRESSION: Severely motion degraded images.  No evidence of acute intracranial abnormality.  Atrophy with small vessel ischemic changes and intracranial atherosclerosis.  CT CERVICAL SPINE  Findings: Motion degraded images.  Straightening of the cervical spine, possibly positional.  No evidence of fracture or dislocation.  The vertebral body heights are maintained.  Dens appears intact.  No prevertebral soft tissue swelling.  Mild multilevel degenerative changes.  Visualized right thyroid is enlarged/nodular.  Visualized lung apices are essentially clear.  IMPRESSION: No evidence of traumatic injury to the cervical spine.  Mild multilevel degenerative changes.   Original Report Authenticated By: Charline Bills, M.D.    Ct Cervical Spine Wo Contrast  08/18/2012  *RADIOLOGY REPORT*  Clinical Data:  Multiple falls, altered mental status  CT HEAD WITHOUT CONTRAST CT CERVICAL SPINE WITHOUT CONTRAST  Technique:  Multidetector CT imaging of the head and cervical spine was performed following the standard protocol without intravenous contrast.  Multiplanar CT image reconstructions of the cervical spine were also generated.  Comparison:  MRI brain dated 09/15/2009  CT HEAD  Findings: Severely motion degraded images.  No evidence of parenchymal hemorrhage or extra-axial fluid collection. No mass lesion, mass effect, or midline shift.  No CT evidence of acute infarction. Subcortical white matter and periventricular small vessel ischemic changes.  Global cortical atrophy.  No  ventriculomegaly.  The visualized paranasal sinuses are essentially clear. The mastoid air cells are unopacified.  No evidence of calvarial fracture.  IMPRESSION: Severely motion degraded images.  No evidence of acute intracranial abnormality.  Atrophy with small vessel ischemic changes and intracranial atherosclerosis.  CT CERVICAL SPINE  Findings: Motion degraded  images.  Straightening of the cervical spine, possibly positional.  No evidence of fracture or dislocation.  The vertebral body heights are maintained.  Dens appears intact.  No prevertebral soft tissue swelling.  Mild multilevel degenerative changes.  Visualized right thyroid is enlarged/nodular.  Visualized lung apices are essentially clear.  IMPRESSION: No evidence of traumatic injury to the cervical spine.  Mild multilevel degenerative changes.   Original Report Authenticated By: Charline Bills, M.D.    US Renal  09/12/2012  *RADIOLOGY REPORT*  Clinical Data: Acute renal failure, diabetes, hypertension  RENAL/URINARY TRACT ULTRASOUND COMPLETE  Comparison:  Ultrasound of the kidneys of 12/14/2011  Findings:  Right Kidney:  No hydronephrosis is seen.  The right kidney measures 12.3 cm sagittally.  The parenchyma of the right kidney does appear to be echogenic consistent with chronic renal medical disease.  Left Kidney:  No hydronephrosis is noted.  The left kidney measures 12.2 cm.  The left renal parenchyma also appears echogenic.  Bladder:  The urinary bladder is decompressed with Foley catheter present.  An incidental right adnexal cyst is noted of 5.0 x 4.4 cm.  Pelvic ultrasound may be helpful if further assessment is warranted.  IMPRESSION:  1.  No hydronephrosis.  Somewhat echogenic renal parenchyma consistent with chronic renal medical disease. 2.  5 cm right adnexal cystic structure.  Consider pelvic ultrasound to assess further.   Original Report Authenticated By: Dwyane Dee, M.D.    Dg Chest Port 1 View  09/14/2012  *RADIOLOGY  REPORT*  Clinical Data: Respiratory failure.  PORTABLE CHEST - 1 VIEW  Comparison: 09/13/2012  Findings: Low lung volumes cardiomegaly and vascular congestion. No overt edema.  No focal opacities or effusions.  No acute bony abnormality.  IMPRESSION: Low lung volumes.  Cardiomegaly, vascular congestion.   Original Report Authenticated By: Charlett Nose, M.D.    Dg Chest Port 1 View  09/13/2012  *RADIOLOGY REPORT*  Clinical Data: Status post line insertion.  PORTABLE CHEST - 1 VIEW  Comparison: Chest x-ray 09/11/2012.  Findings: Lung volumes are low.  Right internal jugular central venous catheter with tip terminating at the superior cavoatrial junction.  No pneumothorax.  No consolidative airspace disease.  No pleural effusions.  Pulmonary vasculature is normal.  Heart size is upper limits of normal. The patient is rotated to the left on today's exam, resulting in distortion of the mediastinal contours and reduced diagnostic sensitivity and specificity for mediastinal pathology.  Atherosclerosis in the thoracic aorta.  IMPRESSION: 1.  Right IJ central venous catheter appears properly located.  No pneumothorax. 2.  Low lung volumes without radiographic evidence of acute cardiopulmonary disease. 3.  Atherosclerosis.   Original Report Authenticated By: Trudie Reed, M.D.    Dg Chest Port 1 View  08/20/2012  *RADIOLOGY REPORT*  Clinical Data: Short of breath, weakness, pulmonary edema  PORTABLE CHEST - 1 VIEW  Comparison: 08/18/2012  Findings: Low lung volumes.  Lungs are essentially clear.  No pleural effusion or pneumothorax.  The heart is top normal in size.  IMPRESSION: No evidence of acute cardiopulmonary disease.  Low lung volumes.   Original Report Authenticated By: Charline Bills, M.D.    Dg Chest Port 1 View  08/18/2012  *RADIOLOGY REPORT*  Clinical Data: Altered mental status.  Fall.  PORTABLE CHEST - 1 VIEW  Comparison: 01/31/2009  Findings: Shallow inspiration.  Normal heart size and pulmonary  vascularity for technique.  No focal airspace consolidation in the lungs.  No blunting of costophrenic angles.  No pneumothorax.  No significant changes since the previous study.  IMPRESSION: No evidence of active disease.   Original Report Authenticated By: Marlon Pel, M.D.    Dg Hand Complete Left  08/19/2012  *RADIOLOGY REPORT*  Clinical Data: Pain and swelling.  Skin tear.  Post chills in the hand and wrist.  LEFT HAND - COMPLETE 3+ VIEW  Comparison: None.  Findings: Degenerative changes in the interphalangeal, first metacarpal phalangeal, first carpometacarpal, and STT joints. Postoperative changes with plate screw fixation of the distal radial metaphysis and old ununited ulnar styloid process.  No acute fracture or subluxation.  No focal bone lesion or bone destruction. Diffuse soft tissue swelling over the carpal area.  No radiopaque soft tissue foreign bodies.  IMPRESSION: Degenerative changes in the hand and wrist.  Postoperative changes in the wrist.  Soft tissue swelling.  No acute bony abnormalities.   Original Report Authenticated By: Marlon Pel, M.D.      Microbiology: Recent Results (from the past 240 hour(s))  CULTURE, BLOOD (ROUTINE X 2)     Status: Normal (Preliminary result)   Collection Time   09/11/12 10:50 PM      Component Value Range Status Comment   Specimen Description BLOOD ARM LEFT   Final    Special Requests BOTTLES DRAWN AEROBIC AND ANAEROBIC 5CC   Final    Culture  Setup Time 09/12/2012 04:54   Final    Culture     Final    Value:        BLOOD CULTURE RECEIVED NO GROWTH TO DATE CULTURE WILL BE HELD FOR 5 DAYS BEFORE ISSUING A FINAL NEGATIVE REPORT   Report Status PENDING   Incomplete   CULTURE, BLOOD (ROUTINE X 2)     Status: Normal (Preliminary result)   Collection Time   09/11/12 11:10 PM      Component Value Range Status Comment   Specimen Description BLOOD HAND RIGHT   Final    Special Requests BOTTLES DRAWN AEROBIC AND ANAEROBIC 6CC   Final     Culture  Setup Time 09/12/2012 04:54   Final    Culture     Final    Value:        BLOOD CULTURE RECEIVED NO GROWTH TO DATE CULTURE WILL BE HELD FOR 5 DAYS BEFORE ISSUING A FINAL NEGATIVE REPORT   Report Status PENDING   Incomplete   MRSA PCR SCREENING     Status: Normal   Collection Time   09/12/12  2:23 AM      Component Value Range Status Comment   MRSA by PCR NEGATIVE  NEGATIVE Final   URINE CULTURE     Status: Normal   Collection Time   09/12/12  9:25 AM      Component Value Range Status Comment   Specimen Description URINE, CATHETERIZED   Final    Special Requests NONE   Final    Culture  Setup Time 09/12/2012 11:31   Final    Colony Count NO GROWTH   Final    Culture NO GROWTH   Final    Report Status 09/13/2012 FINAL   Final      Labs: Basic Metabolic Panel:  Lab 09/17/12 9811 09/16/12 0605 09/15/12 0238 09/14/12 1510 09/14/12 0430 09/13/12 0519 09/12/12 1647 09/12/12 0938  NA 141 142 136 140 136 -- -- --  K 3.2* 3.4* -- -- -- -- -- --  CL 104 107 103 106 102 -- -- --  CO2 26 28 25 26 25  -- -- --  GLUCOSE 212* 165* 258* 193* 287* -- -- --  BUN 23 27* 33* 35* 43* -- -- --  CREATININE 1.43* 1.69* 2.26* 2.51* 3.19* -- -- --  CALCIUM 8.4 8.5 7.7* 7.4* 7.5* -- -- --  MG -- -- 1.3* -- 1.3* -- -- --  PHOS -- -- 2.7 -- 2.6 4.5 5.7* 6.0*   Liver Function Tests:  Lab 09/14/12 1510 09/13/12 0519 09/12/12 1647 09/12/12 0938 09/11/12 1927  AST 36 -- -- -- 32  ALT 28 -- -- -- 33  ALKPHOS 60 -- -- -- 89  BILITOT 0.1* -- -- -- 0.1*  PROT 5.2* -- -- -- 7.1  ALBUMIN 2.4* 2.6* 2.9* 2.9* 3.6   No results found for this basename: LIPASE:5,AMYLASE:5 in the last 168 hours No results found for this basename: AMMONIA:5 in the last 168 hours CBC:  Lab 09/17/12 0004 09/14/12 0430 09/13/12 0519 09/12/12 0255 09/11/12 1927  WBC 7.2 5.3 4.0 7.8 8.8  NEUTROABS -- -- -- -- --  HGB 8.2* 8.1* 7.7* 9.2* 9.8*  HCT 24.3* 23.9* 23.5* 28.4* 30.0*  MCV 90.3 89.5 90.7 92.8 91.5  PLT 198  185 154 203 230   Cardiac Enzymes:  Lab 09/15/12 0239 09/14/12 1805 09/14/12 1039 09/13/12 2208 09/13/12 1651 09/12/12 0938  CKTOTAL -- -- -- -- -- 139  CKMB -- -- -- -- -- --  CKMBINDEX -- -- -- -- -- --  TROPONINI 0.82* 0.97* 0.97* 1.19* 0.97* --   BNP: No components found with this basename: POCBNP:5 CBG:  Lab 09/17/12 1655 09/17/12 1212 09/17/12 0757 09/16/12 2141 09/16/12 1634  GLUCAP 254* 144* 89 171* 175*    Time coordinating discharge:  Greater than 30 minutes  Signed:  Zhaire Locker, DO [facility] Pager: [phone] 09/17/2012, 5:43 PM

## 2012-09-18 LAB — CULTURE, BLOOD (ROUTINE X 2): Culture: NO GROWTH

## 2012-09-18 NOTE — Progress Notes (Signed)
Late entry:      CARE MANAGEMENT NOTE 09/18/2012  Patient:  Angie Wang, Angie Wang   Account Number:  [id number]  Date Initiated:  09/17/2012  Documentation initiated by:  Raizy Auzenne  Subjective/Objective Assessment:   Referral for [facility] needs.     Action/Plan:   Met with pt who selected [facility] for HHPT and HHOT, [facility] notified.   Anticipated DC Date:  09/17/2012   Anticipated DC Plan:  HOME W HOME HEALTH SERVICES         [facility] Choice  HOME HEALTH   Choice offered to / List presented to:  C-1 Patient        HH arranged  HH-2 PT  HH-3 OT      [facility] agency  [facility]   Status of service:  Completed, signed off Medicare Important Message given?   (If response is "NO", the following Medicare IM given date fields will be blank) Date Medicare IM given:   Date Additional Medicare IM given:    Discharge Disposition:  HOME W HOME HEALTH SERVICES  Per UR Regulation:    If discussed at Long Length of Stay Meetings, dates discussed:    Comments:

## 2014-07-02 IMAGING — CR DG CHEST 1V PORT
1 series · 1 of 1 positions shown · non-contrast
Comparison: 08/18/2012

CLINICAL DATA: Short of breath, weakness, pulmonary edema

PORTABLE CHEST - 1 VIEW

[AP]
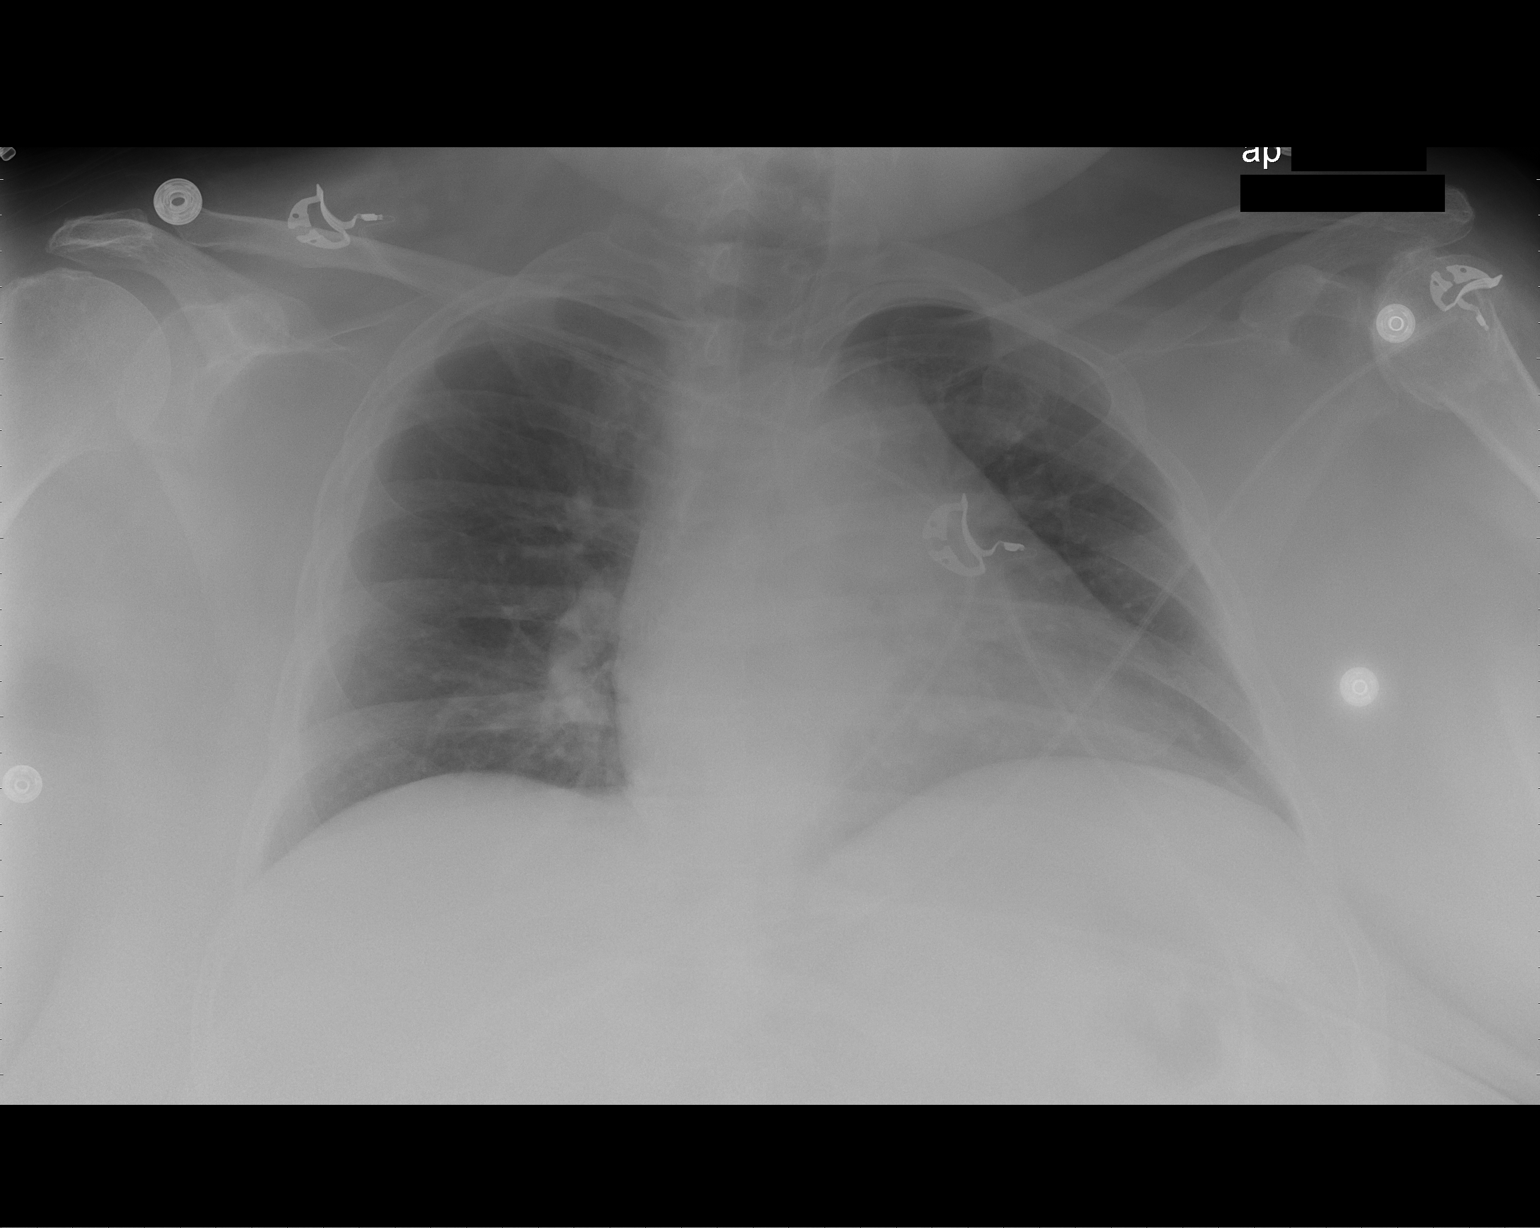

[1 of 1 positions shown; findings below may reference images not displayed]

FINDINGS: Low lung volumes.  Lungs are essentially clear.  No
pleural effusion or pneumothorax.

The heart is top normal in size.
IMPRESSION: No evidence of acute cardiopulmonary disease.

Low lung volumes.

## 2014-07-29 IMAGING — CR DG CHEST 2V
2 series · 2 of 2 positions shown · non-contrast
Comparison: 09/14/2012.

CLINICAL DATA: Short of breath.  Cough.

CHEST - 2 VIEW

[w chest pa]
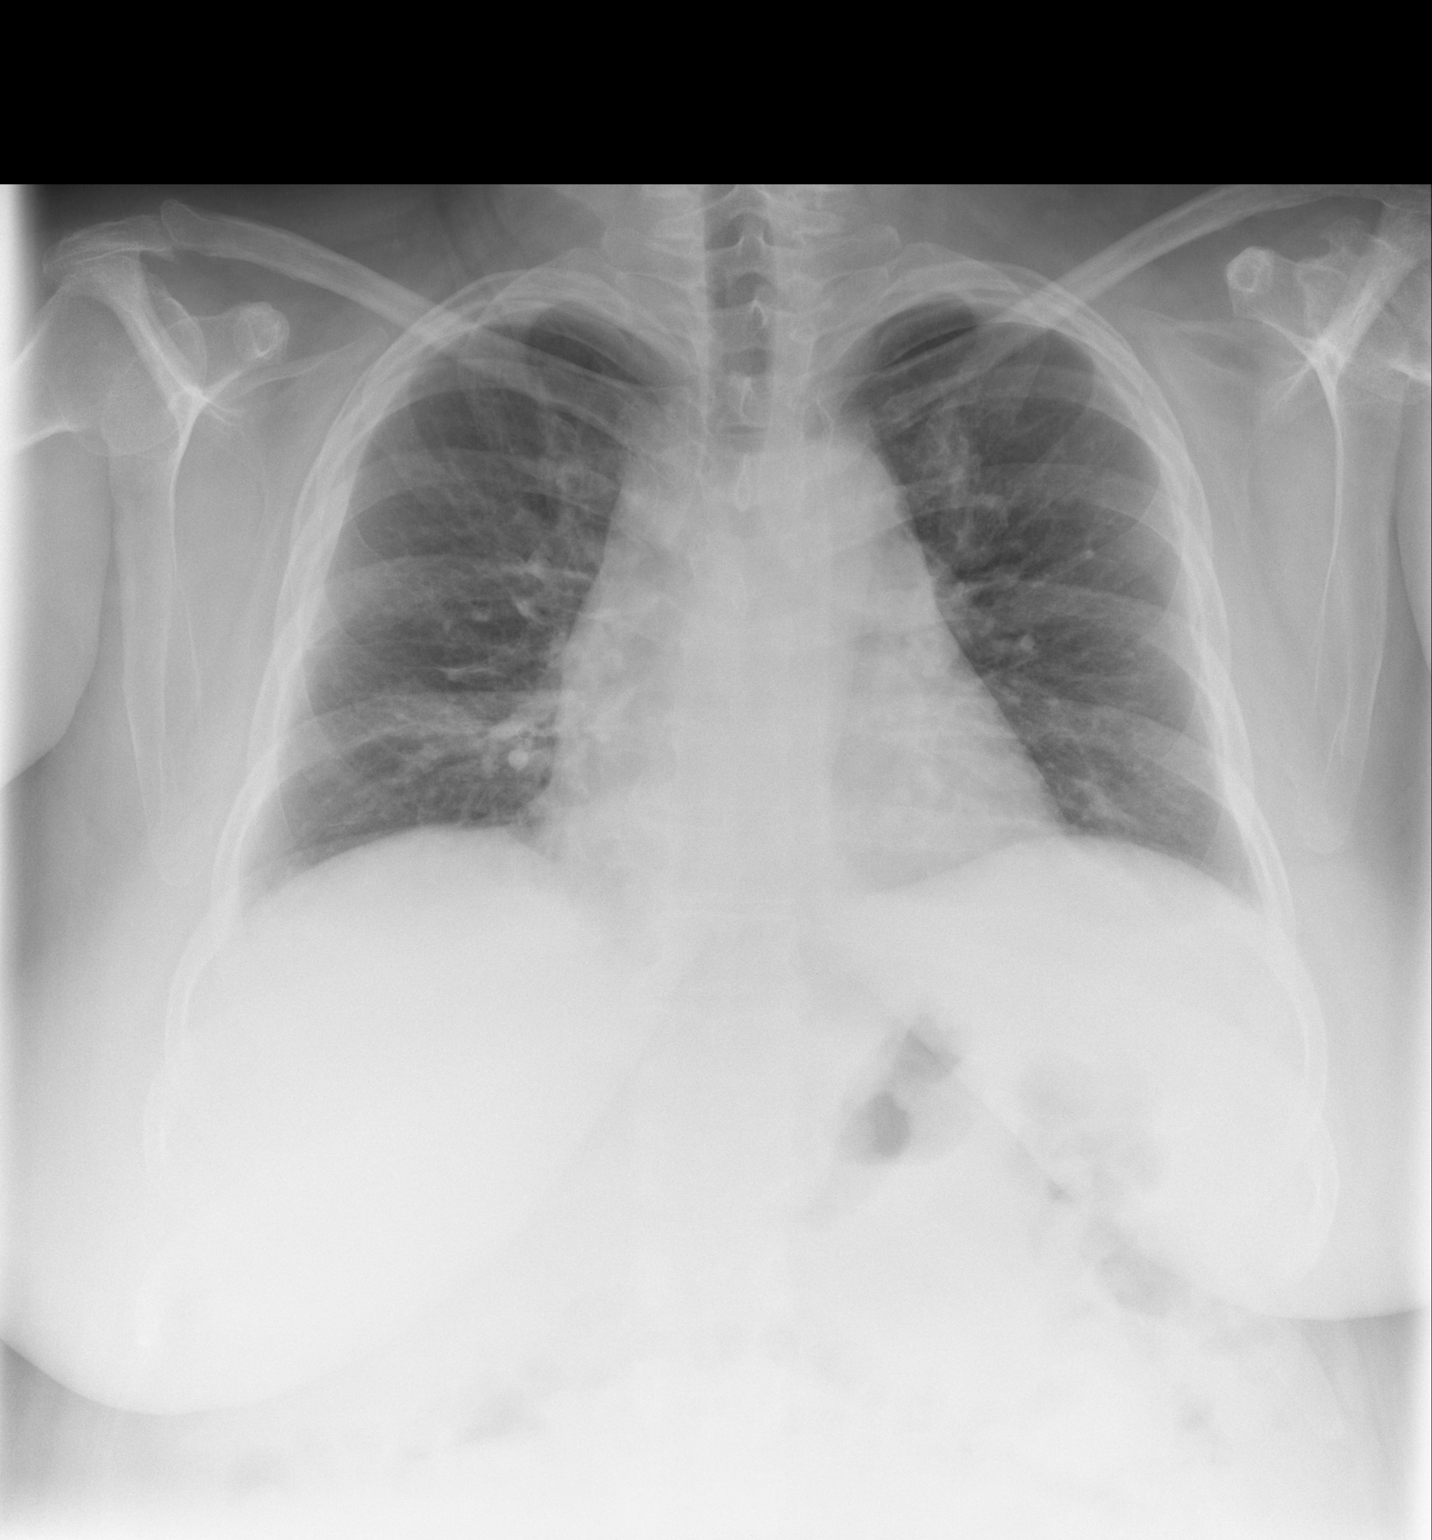

[w chest lat]
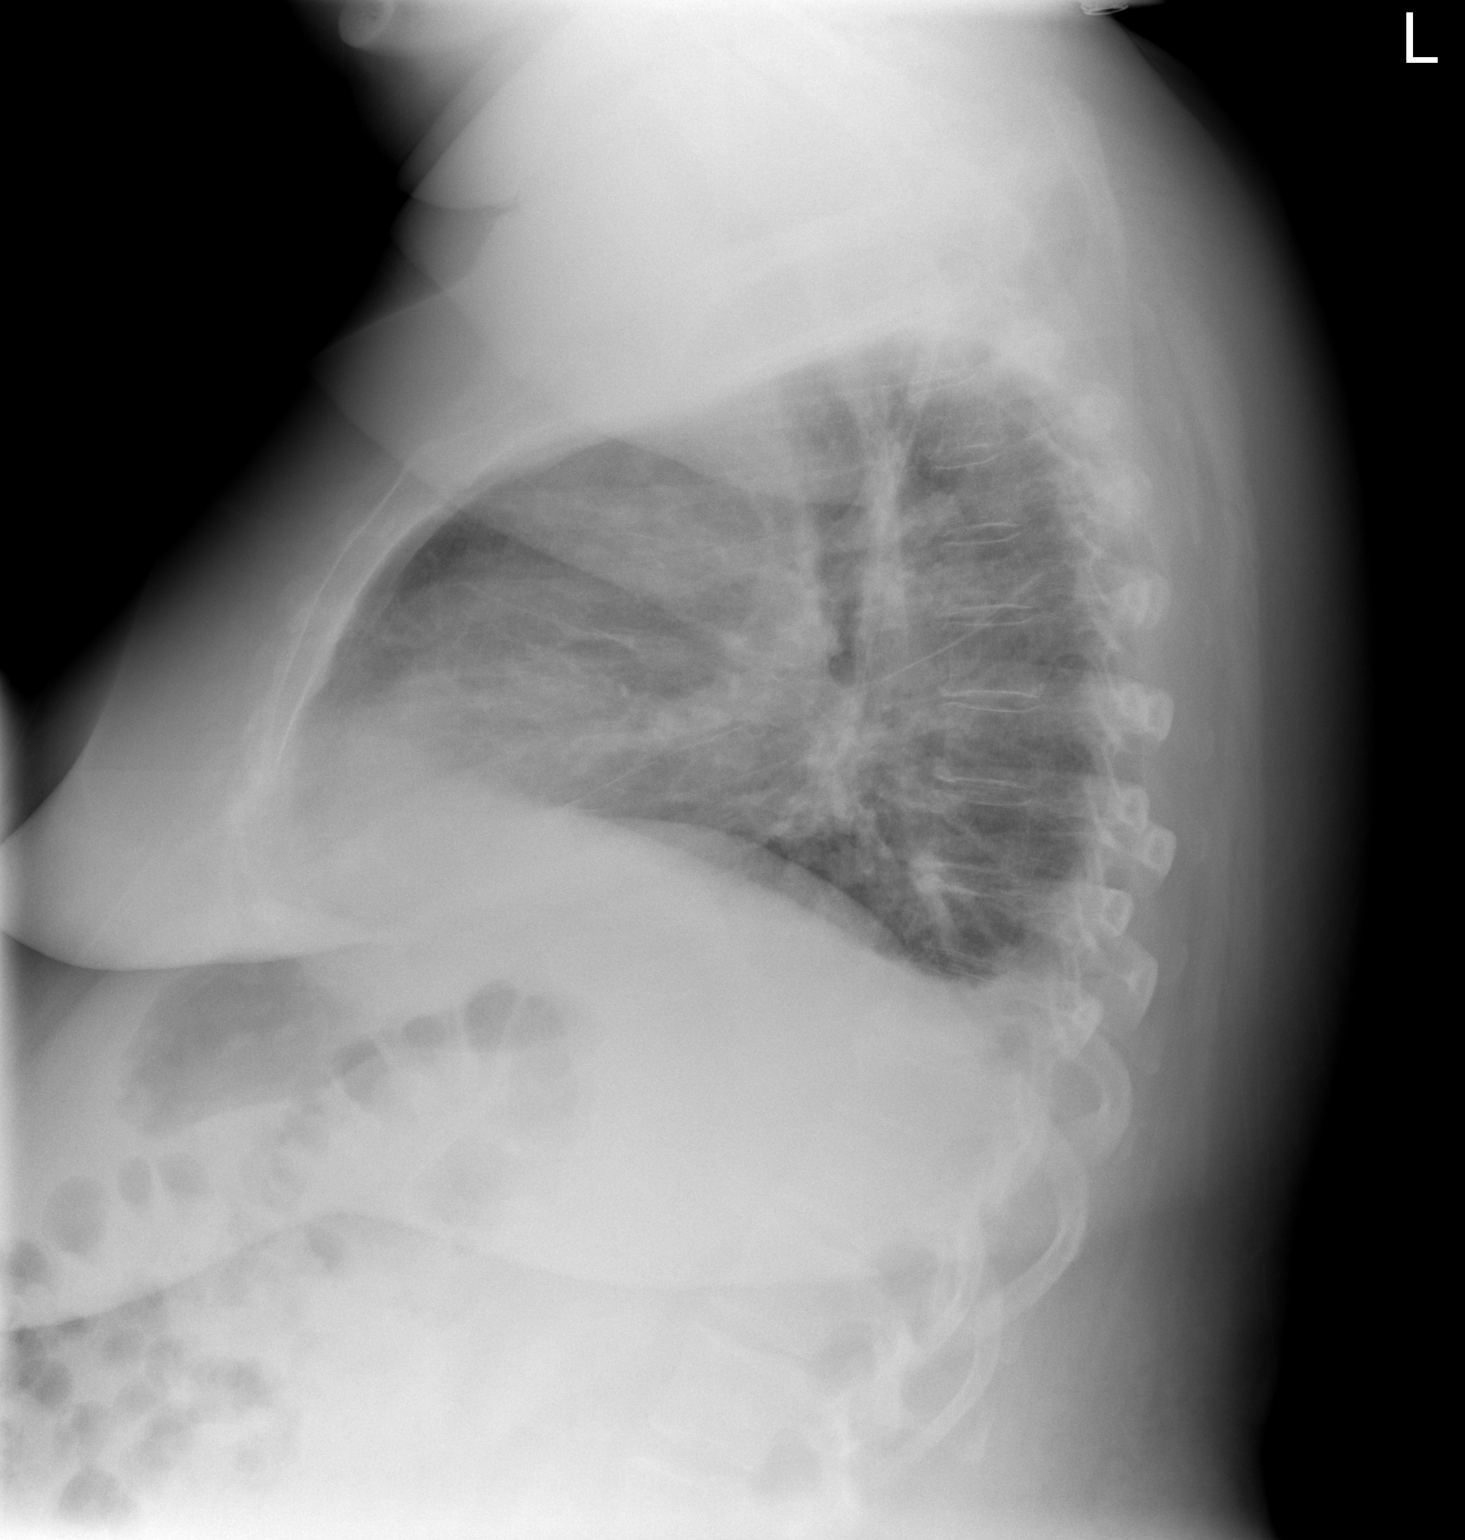

[2 of 2 positions shown; findings below may reference images not displayed]

FINDINGS: Low volume chest.  Basilar atelectasis.  Small pleural
effusion identified on the lateral view, small bilateral pleural
effusions on the lateral view.  No focal consolidation.
Cardiopericardial silhouette is within normal limits for
projection.
IMPRESSION: Small bilateral pleural effusions and low volume chest with basilar
atelectasis.

## 2015-10-24 DIAGNOSIS — R0902 Hypoxemia: Secondary | ICD-10-CM | POA: Diagnosis not present

## 2015-11-06 DIAGNOSIS — Z23 Encounter for immunization: Secondary | ICD-10-CM | POA: Diagnosis not present

## 2015-11-06 DIAGNOSIS — M47816 Spondylosis without myelopathy or radiculopathy, lumbar region: Secondary | ICD-10-CM | POA: Diagnosis not present

## 2015-11-24 DIAGNOSIS — R0902 Hypoxemia: Secondary | ICD-10-CM | POA: Diagnosis not present

## 2015-12-08 DIAGNOSIS — M47816 Spondylosis without myelopathy or radiculopathy, lumbar region: Secondary | ICD-10-CM | POA: Diagnosis not present

## 2015-12-08 DIAGNOSIS — I10 Essential (primary) hypertension: Secondary | ICD-10-CM | POA: Diagnosis not present

## 2015-12-08 DIAGNOSIS — E785 Hyperlipidemia, unspecified: Secondary | ICD-10-CM | POA: Diagnosis not present

## 2015-12-08 DIAGNOSIS — E1121 Type 2 diabetes mellitus with diabetic nephropathy: Secondary | ICD-10-CM | POA: Diagnosis not present

## 2015-12-08 DIAGNOSIS — N183 Chronic kidney disease, stage 3 (moderate): Secondary | ICD-10-CM | POA: Diagnosis not present

## 2015-12-14 DIAGNOSIS — E1121 Type 2 diabetes mellitus with diabetic nephropathy: Secondary | ICD-10-CM | POA: Diagnosis not present

## 2015-12-25 DIAGNOSIS — R0902 Hypoxemia: Secondary | ICD-10-CM | POA: Diagnosis not present

## 2016-01-08 DIAGNOSIS — B372 Candidiasis of skin and nail: Secondary | ICD-10-CM | POA: Diagnosis not present

## 2016-01-08 DIAGNOSIS — N183 Chronic kidney disease, stage 3 (moderate): Secondary | ICD-10-CM | POA: Diagnosis not present

## 2016-01-08 DIAGNOSIS — I10 Essential (primary) hypertension: Secondary | ICD-10-CM | POA: Diagnosis not present

## 2016-01-08 DIAGNOSIS — E119 Type 2 diabetes mellitus without complications: Secondary | ICD-10-CM | POA: Diagnosis not present

## 2016-01-22 DIAGNOSIS — R0902 Hypoxemia: Secondary | ICD-10-CM | POA: Diagnosis not present

## 2016-02-10 DIAGNOSIS — R928 Other abnormal and inconclusive findings on diagnostic imaging of breast: Secondary | ICD-10-CM | POA: Diagnosis not present

## 2016-02-10 DIAGNOSIS — R921 Mammographic calcification found on diagnostic imaging of breast: Secondary | ICD-10-CM | POA: Diagnosis not present

## 2016-02-22 DIAGNOSIS — R0902 Hypoxemia: Secondary | ICD-10-CM | POA: Diagnosis not present

## 2016-03-23 DIAGNOSIS — R0902 Hypoxemia: Secondary | ICD-10-CM | POA: Diagnosis not present

## 2016-04-07 DIAGNOSIS — I10 Essential (primary) hypertension: Secondary | ICD-10-CM | POA: Diagnosis not present

## 2016-04-07 DIAGNOSIS — Z794 Long term (current) use of insulin: Secondary | ICD-10-CM | POA: Diagnosis not present

## 2016-04-07 DIAGNOSIS — E1140 Type 2 diabetes mellitus with diabetic neuropathy, unspecified: Secondary | ICD-10-CM | POA: Diagnosis not present

## 2016-04-07 DIAGNOSIS — M47816 Spondylosis without myelopathy or radiculopathy, lumbar region: Secondary | ICD-10-CM | POA: Diagnosis not present

## 2016-04-07 DIAGNOSIS — E785 Hyperlipidemia, unspecified: Secondary | ICD-10-CM | POA: Diagnosis not present

## 2016-04-07 DIAGNOSIS — E1165 Type 2 diabetes mellitus with hyperglycemia: Secondary | ICD-10-CM | POA: Diagnosis not present

## 2016-04-07 DIAGNOSIS — E1121 Type 2 diabetes mellitus with diabetic nephropathy: Secondary | ICD-10-CM | POA: Diagnosis not present

## 2016-04-07 DIAGNOSIS — N183 Chronic kidney disease, stage 3 (moderate): Secondary | ICD-10-CM | POA: Diagnosis not present

## 2016-04-23 DIAGNOSIS — R0902 Hypoxemia: Secondary | ICD-10-CM | POA: Diagnosis not present

## 2016-05-12 DIAGNOSIS — E1121 Type 2 diabetes mellitus with diabetic nephropathy: Secondary | ICD-10-CM | POA: Diagnosis not present

## 2016-05-12 DIAGNOSIS — J449 Chronic obstructive pulmonary disease, unspecified: Secondary | ICD-10-CM | POA: Diagnosis not present

## 2016-05-12 DIAGNOSIS — M47816 Spondylosis without myelopathy or radiculopathy, lumbar region: Secondary | ICD-10-CM | POA: Diagnosis not present

## 2016-05-12 DIAGNOSIS — N183 Chronic kidney disease, stage 3 (moderate): Secondary | ICD-10-CM | POA: Diagnosis not present

## 2016-05-12 DIAGNOSIS — I10 Essential (primary) hypertension: Secondary | ICD-10-CM | POA: Diagnosis not present

## 2016-05-12 DIAGNOSIS — E559 Vitamin D deficiency, unspecified: Secondary | ICD-10-CM | POA: Diagnosis not present

## 2016-05-16 DIAGNOSIS — E1121 Type 2 diabetes mellitus with diabetic nephropathy: Secondary | ICD-10-CM | POA: Diagnosis not present

## 2016-05-23 DIAGNOSIS — R0902 Hypoxemia: Secondary | ICD-10-CM | POA: Diagnosis not present

## 2016-06-23 DIAGNOSIS — R0902 Hypoxemia: Secondary | ICD-10-CM | POA: Diagnosis not present

## 2016-07-08 DIAGNOSIS — Z794 Long term (current) use of insulin: Secondary | ICD-10-CM | POA: Diagnosis not present

## 2016-07-08 DIAGNOSIS — E1165 Type 2 diabetes mellitus with hyperglycemia: Secondary | ICD-10-CM | POA: Diagnosis not present

## 2016-07-08 DIAGNOSIS — E1140 Type 2 diabetes mellitus with diabetic neuropathy, unspecified: Secondary | ICD-10-CM | POA: Diagnosis not present

## 2016-07-24 DIAGNOSIS — R0902 Hypoxemia: Secondary | ICD-10-CM | POA: Diagnosis not present

## 2016-08-17 DIAGNOSIS — E119 Type 2 diabetes mellitus without complications: Secondary | ICD-10-CM | POA: Diagnosis not present

## 2016-08-17 DIAGNOSIS — N183 Chronic kidney disease, stage 3 (moderate): Secondary | ICD-10-CM | POA: Diagnosis not present

## 2016-08-17 DIAGNOSIS — I10 Essential (primary) hypertension: Secondary | ICD-10-CM | POA: Diagnosis not present

## 2016-08-23 DIAGNOSIS — R0902 Hypoxemia: Secondary | ICD-10-CM | POA: Diagnosis not present

## 2016-09-12 DIAGNOSIS — J22 Unspecified acute lower respiratory infection: Secondary | ICD-10-CM | POA: Diagnosis not present

## 2016-09-12 DIAGNOSIS — Z1389 Encounter for screening for other disorder: Secondary | ICD-10-CM | POA: Diagnosis not present

## 2016-09-12 DIAGNOSIS — Z9181 History of falling: Secondary | ICD-10-CM | POA: Diagnosis not present

## 2016-09-12 DIAGNOSIS — J449 Chronic obstructive pulmonary disease, unspecified: Secondary | ICD-10-CM | POA: Diagnosis not present

## 2016-09-12 DIAGNOSIS — E1121 Type 2 diabetes mellitus with diabetic nephropathy: Secondary | ICD-10-CM | POA: Diagnosis not present

## 2016-09-12 DIAGNOSIS — I10 Essential (primary) hypertension: Secondary | ICD-10-CM | POA: Diagnosis not present

## 2016-09-12 DIAGNOSIS — Z23 Encounter for immunization: Secondary | ICD-10-CM | POA: Diagnosis not present

## 2016-09-23 DIAGNOSIS — R0902 Hypoxemia: Secondary | ICD-10-CM | POA: Diagnosis not present

## 2016-10-03 DIAGNOSIS — R928 Other abnormal and inconclusive findings on diagnostic imaging of breast: Secondary | ICD-10-CM | POA: Diagnosis not present

## 2016-10-03 DIAGNOSIS — R921 Mammographic calcification found on diagnostic imaging of breast: Secondary | ICD-10-CM | POA: Diagnosis not present

## 2016-10-23 DIAGNOSIS — R0902 Hypoxemia: Secondary | ICD-10-CM | POA: Diagnosis not present

## 2016-11-23 DIAGNOSIS — R0902 Hypoxemia: Secondary | ICD-10-CM | POA: Diagnosis not present

## 2016-12-24 DIAGNOSIS — R0902 Hypoxemia: Secondary | ICD-10-CM | POA: Diagnosis not present

## 2017-01-21 DIAGNOSIS — R0902 Hypoxemia: Secondary | ICD-10-CM | POA: Diagnosis not present

## 2017-01-23 DIAGNOSIS — G4733 Obstructive sleep apnea (adult) (pediatric): Secondary | ICD-10-CM | POA: Diagnosis not present

## 2017-01-23 DIAGNOSIS — J4530 Mild persistent asthma, uncomplicated: Secondary | ICD-10-CM | POA: Diagnosis not present

## 2017-01-23 DIAGNOSIS — R5383 Other fatigue: Secondary | ICD-10-CM | POA: Diagnosis not present

## 2017-02-21 DIAGNOSIS — R0902 Hypoxemia: Secondary | ICD-10-CM | POA: Diagnosis not present

## 2017-03-09 DIAGNOSIS — I10 Essential (primary) hypertension: Secondary | ICD-10-CM | POA: Diagnosis not present

## 2017-03-09 DIAGNOSIS — E1121 Type 2 diabetes mellitus with diabetic nephropathy: Secondary | ICD-10-CM | POA: Diagnosis not present

## 2017-03-09 DIAGNOSIS — J449 Chronic obstructive pulmonary disease, unspecified: Secondary | ICD-10-CM | POA: Diagnosis not present

## 2017-03-19 DIAGNOSIS — J3089 Other allergic rhinitis: Secondary | ICD-10-CM | POA: Diagnosis not present

## 2017-03-19 DIAGNOSIS — R42 Dizziness and giddiness: Secondary | ICD-10-CM | POA: Diagnosis not present

## 2017-03-19 DIAGNOSIS — R51 Headache: Secondary | ICD-10-CM | POA: Diagnosis not present

## 2017-03-19 DIAGNOSIS — R52 Pain, unspecified: Secondary | ICD-10-CM | POA: Diagnosis not present

## 2017-03-19 DIAGNOSIS — J9690 Respiratory failure, unspecified, unspecified whether with hypoxia or hypercapnia: Secondary | ICD-10-CM | POA: Diagnosis not present

## 2017-03-19 DIAGNOSIS — R531 Weakness: Secondary | ICD-10-CM | POA: Diagnosis not present

## 2017-03-19 DIAGNOSIS — N390 Urinary tract infection, site not specified: Secondary | ICD-10-CM | POA: Diagnosis not present

## 2017-03-20 DIAGNOSIS — R51 Headache: Secondary | ICD-10-CM | POA: Diagnosis not present

## 2017-03-20 DIAGNOSIS — J9690 Respiratory failure, unspecified, unspecified whether with hypoxia or hypercapnia: Secondary | ICD-10-CM | POA: Diagnosis not present

## 2017-03-23 DIAGNOSIS — R0902 Hypoxemia: Secondary | ICD-10-CM | POA: Diagnosis not present

## 2017-03-23 DIAGNOSIS — D72829 Elevated white blood cell count, unspecified: Secondary | ICD-10-CM | POA: Diagnosis not present

## 2017-04-10 DIAGNOSIS — E1165 Type 2 diabetes mellitus with hyperglycemia: Secondary | ICD-10-CM | POA: Diagnosis not present

## 2017-04-10 DIAGNOSIS — Z794 Long term (current) use of insulin: Secondary | ICD-10-CM | POA: Diagnosis not present

## 2017-04-10 DIAGNOSIS — E1140 Type 2 diabetes mellitus with diabetic neuropathy, unspecified: Secondary | ICD-10-CM | POA: Diagnosis not present

## 2017-04-23 DIAGNOSIS — R0902 Hypoxemia: Secondary | ICD-10-CM | POA: Diagnosis not present

## 2017-05-23 DIAGNOSIS — R0902 Hypoxemia: Secondary | ICD-10-CM | POA: Diagnosis not present

## 2017-06-23 DIAGNOSIS — R0902 Hypoxemia: Secondary | ICD-10-CM | POA: Diagnosis not present

## 2017-07-10 DIAGNOSIS — E1121 Type 2 diabetes mellitus with diabetic nephropathy: Secondary | ICD-10-CM | POA: Diagnosis not present

## 2017-07-10 DIAGNOSIS — J449 Chronic obstructive pulmonary disease, unspecified: Secondary | ICD-10-CM | POA: Diagnosis not present

## 2017-07-10 DIAGNOSIS — Z139 Encounter for screening, unspecified: Secondary | ICD-10-CM | POA: Diagnosis not present

## 2017-07-10 DIAGNOSIS — I10 Essential (primary) hypertension: Secondary | ICD-10-CM | POA: Diagnosis not present

## 2017-07-10 DIAGNOSIS — Z9181 History of falling: Secondary | ICD-10-CM | POA: Diagnosis not present

## 2017-07-10 DIAGNOSIS — E785 Hyperlipidemia, unspecified: Secondary | ICD-10-CM | POA: Diagnosis not present

## 2017-07-24 DIAGNOSIS — R0902 Hypoxemia: Secondary | ICD-10-CM | POA: Diagnosis not present

## 2017-07-31 DIAGNOSIS — Z23 Encounter for immunization: Secondary | ICD-10-CM | POA: Diagnosis not present

## 2017-07-31 DIAGNOSIS — R220 Localized swelling, mass and lump, head: Secondary | ICD-10-CM | POA: Diagnosis not present

## 2017-08-03 DIAGNOSIS — J449 Chronic obstructive pulmonary disease, unspecified: Secondary | ICD-10-CM | POA: Diagnosis not present

## 2017-08-03 DIAGNOSIS — E1121 Type 2 diabetes mellitus with diabetic nephropathy: Secondary | ICD-10-CM | POA: Diagnosis not present

## 2017-08-03 DIAGNOSIS — M47816 Spondylosis without myelopathy or radiculopathy, lumbar region: Secondary | ICD-10-CM | POA: Diagnosis not present

## 2017-08-03 DIAGNOSIS — E785 Hyperlipidemia, unspecified: Secondary | ICD-10-CM | POA: Diagnosis not present

## 2017-08-03 DIAGNOSIS — E1122 Type 2 diabetes mellitus with diabetic chronic kidney disease: Secondary | ICD-10-CM | POA: Diagnosis not present

## 2017-08-03 DIAGNOSIS — Z9181 History of falling: Secondary | ICD-10-CM | POA: Diagnosis not present

## 2017-08-03 DIAGNOSIS — N183 Chronic kidney disease, stage 3 (moderate): Secondary | ICD-10-CM | POA: Diagnosis not present

## 2017-08-03 DIAGNOSIS — I129 Hypertensive chronic kidney disease with stage 1 through stage 4 chronic kidney disease, or unspecified chronic kidney disease: Secondary | ICD-10-CM | POA: Diagnosis not present

## 2017-08-03 DIAGNOSIS — E1165 Type 2 diabetes mellitus with hyperglycemia: Secondary | ICD-10-CM | POA: Diagnosis not present

## 2017-08-03 DIAGNOSIS — Z794 Long term (current) use of insulin: Secondary | ICD-10-CM | POA: Diagnosis not present

## 2017-08-03 DIAGNOSIS — Z602 Problems related to living alone: Secondary | ICD-10-CM | POA: Diagnosis not present

## 2017-08-03 DIAGNOSIS — M791 Myalgia, unspecified site: Secondary | ICD-10-CM | POA: Diagnosis not present

## 2017-08-04 DIAGNOSIS — M791 Myalgia, unspecified site: Secondary | ICD-10-CM | POA: Diagnosis not present

## 2017-08-04 DIAGNOSIS — E1122 Type 2 diabetes mellitus with diabetic chronic kidney disease: Secondary | ICD-10-CM | POA: Diagnosis not present

## 2017-08-04 DIAGNOSIS — M47816 Spondylosis without myelopathy or radiculopathy, lumbar region: Secondary | ICD-10-CM | POA: Diagnosis not present

## 2017-08-04 DIAGNOSIS — Z602 Problems related to living alone: Secondary | ICD-10-CM | POA: Diagnosis not present

## 2017-08-04 DIAGNOSIS — I129 Hypertensive chronic kidney disease with stage 1 through stage 4 chronic kidney disease, or unspecified chronic kidney disease: Secondary | ICD-10-CM | POA: Diagnosis not present

## 2017-08-04 DIAGNOSIS — J449 Chronic obstructive pulmonary disease, unspecified: Secondary | ICD-10-CM | POA: Diagnosis not present

## 2017-08-04 DIAGNOSIS — E1165 Type 2 diabetes mellitus with hyperglycemia: Secondary | ICD-10-CM | POA: Diagnosis not present

## 2017-08-04 DIAGNOSIS — E1121 Type 2 diabetes mellitus with diabetic nephropathy: Secondary | ICD-10-CM | POA: Diagnosis not present

## 2017-08-04 DIAGNOSIS — N183 Chronic kidney disease, stage 3 (moderate): Secondary | ICD-10-CM | POA: Diagnosis not present

## 2017-08-04 DIAGNOSIS — E785 Hyperlipidemia, unspecified: Secondary | ICD-10-CM | POA: Diagnosis not present

## 2017-08-04 DIAGNOSIS — Z9181 History of falling: Secondary | ICD-10-CM | POA: Diagnosis not present

## 2017-08-04 DIAGNOSIS — Z794 Long term (current) use of insulin: Secondary | ICD-10-CM | POA: Diagnosis not present

## 2017-08-07 DIAGNOSIS — Z9181 History of falling: Secondary | ICD-10-CM | POA: Diagnosis not present

## 2017-08-07 DIAGNOSIS — N183 Chronic kidney disease, stage 3 (moderate): Secondary | ICD-10-CM | POA: Diagnosis not present

## 2017-08-07 DIAGNOSIS — I129 Hypertensive chronic kidney disease with stage 1 through stage 4 chronic kidney disease, or unspecified chronic kidney disease: Secondary | ICD-10-CM | POA: Diagnosis not present

## 2017-08-07 DIAGNOSIS — M47816 Spondylosis without myelopathy or radiculopathy, lumbar region: Secondary | ICD-10-CM | POA: Diagnosis not present

## 2017-08-07 DIAGNOSIS — Z602 Problems related to living alone: Secondary | ICD-10-CM | POA: Diagnosis not present

## 2017-08-07 DIAGNOSIS — E1122 Type 2 diabetes mellitus with diabetic chronic kidney disease: Secondary | ICD-10-CM | POA: Diagnosis not present

## 2017-08-07 DIAGNOSIS — E785 Hyperlipidemia, unspecified: Secondary | ICD-10-CM | POA: Diagnosis not present

## 2017-08-07 DIAGNOSIS — M791 Myalgia, unspecified site: Secondary | ICD-10-CM | POA: Diagnosis not present

## 2017-08-07 DIAGNOSIS — Z794 Long term (current) use of insulin: Secondary | ICD-10-CM | POA: Diagnosis not present

## 2017-08-07 DIAGNOSIS — E1121 Type 2 diabetes mellitus with diabetic nephropathy: Secondary | ICD-10-CM | POA: Diagnosis not present

## 2017-08-07 DIAGNOSIS — E1165 Type 2 diabetes mellitus with hyperglycemia: Secondary | ICD-10-CM | POA: Diagnosis not present

## 2017-08-07 DIAGNOSIS — J449 Chronic obstructive pulmonary disease, unspecified: Secondary | ICD-10-CM | POA: Diagnosis not present

## 2017-08-09 DIAGNOSIS — Z9181 History of falling: Secondary | ICD-10-CM | POA: Diagnosis not present

## 2017-08-09 DIAGNOSIS — N183 Chronic kidney disease, stage 3 (moderate): Secondary | ICD-10-CM | POA: Diagnosis not present

## 2017-08-09 DIAGNOSIS — E1121 Type 2 diabetes mellitus with diabetic nephropathy: Secondary | ICD-10-CM | POA: Diagnosis not present

## 2017-08-09 DIAGNOSIS — E1122 Type 2 diabetes mellitus with diabetic chronic kidney disease: Secondary | ICD-10-CM | POA: Diagnosis not present

## 2017-08-09 DIAGNOSIS — I129 Hypertensive chronic kidney disease with stage 1 through stage 4 chronic kidney disease, or unspecified chronic kidney disease: Secondary | ICD-10-CM | POA: Diagnosis not present

## 2017-08-09 DIAGNOSIS — M47816 Spondylosis without myelopathy or radiculopathy, lumbar region: Secondary | ICD-10-CM | POA: Diagnosis not present

## 2017-08-09 DIAGNOSIS — E1165 Type 2 diabetes mellitus with hyperglycemia: Secondary | ICD-10-CM | POA: Diagnosis not present

## 2017-08-09 DIAGNOSIS — J449 Chronic obstructive pulmonary disease, unspecified: Secondary | ICD-10-CM | POA: Diagnosis not present

## 2017-08-09 DIAGNOSIS — Z794 Long term (current) use of insulin: Secondary | ICD-10-CM | POA: Diagnosis not present

## 2017-08-09 DIAGNOSIS — E785 Hyperlipidemia, unspecified: Secondary | ICD-10-CM | POA: Diagnosis not present

## 2017-08-09 DIAGNOSIS — Z602 Problems related to living alone: Secondary | ICD-10-CM | POA: Diagnosis not present

## 2017-08-09 DIAGNOSIS — M791 Myalgia, unspecified site: Secondary | ICD-10-CM | POA: Diagnosis not present

## 2017-08-10 DIAGNOSIS — E785 Hyperlipidemia, unspecified: Secondary | ICD-10-CM | POA: Diagnosis not present

## 2017-08-10 DIAGNOSIS — E1121 Type 2 diabetes mellitus with diabetic nephropathy: Secondary | ICD-10-CM | POA: Diagnosis not present

## 2017-08-10 DIAGNOSIS — N183 Chronic kidney disease, stage 3 (moderate): Secondary | ICD-10-CM | POA: Diagnosis not present

## 2017-08-10 DIAGNOSIS — J449 Chronic obstructive pulmonary disease, unspecified: Secondary | ICD-10-CM | POA: Diagnosis not present

## 2017-08-10 DIAGNOSIS — M791 Myalgia, unspecified site: Secondary | ICD-10-CM | POA: Diagnosis not present

## 2017-08-10 DIAGNOSIS — E1122 Type 2 diabetes mellitus with diabetic chronic kidney disease: Secondary | ICD-10-CM | POA: Diagnosis not present

## 2017-08-10 DIAGNOSIS — M47816 Spondylosis without myelopathy or radiculopathy, lumbar region: Secondary | ICD-10-CM | POA: Diagnosis not present

## 2017-08-10 DIAGNOSIS — E1165 Type 2 diabetes mellitus with hyperglycemia: Secondary | ICD-10-CM | POA: Diagnosis not present

## 2017-08-10 DIAGNOSIS — I129 Hypertensive chronic kidney disease with stage 1 through stage 4 chronic kidney disease, or unspecified chronic kidney disease: Secondary | ICD-10-CM | POA: Diagnosis not present

## 2017-08-10 DIAGNOSIS — E1140 Type 2 diabetes mellitus with diabetic neuropathy, unspecified: Secondary | ICD-10-CM | POA: Diagnosis not present

## 2017-08-10 DIAGNOSIS — Z9181 History of falling: Secondary | ICD-10-CM | POA: Diagnosis not present

## 2017-08-10 DIAGNOSIS — Z602 Problems related to living alone: Secondary | ICD-10-CM | POA: Diagnosis not present

## 2017-08-10 DIAGNOSIS — Z794 Long term (current) use of insulin: Secondary | ICD-10-CM | POA: Diagnosis not present

## 2017-08-14 DIAGNOSIS — E785 Hyperlipidemia, unspecified: Secondary | ICD-10-CM | POA: Diagnosis not present

## 2017-08-14 DIAGNOSIS — J449 Chronic obstructive pulmonary disease, unspecified: Secondary | ICD-10-CM | POA: Diagnosis not present

## 2017-08-14 DIAGNOSIS — Z9181 History of falling: Secondary | ICD-10-CM | POA: Diagnosis not present

## 2017-08-14 DIAGNOSIS — N183 Chronic kidney disease, stage 3 (moderate): Secondary | ICD-10-CM | POA: Diagnosis not present

## 2017-08-14 DIAGNOSIS — E1121 Type 2 diabetes mellitus with diabetic nephropathy: Secondary | ICD-10-CM | POA: Diagnosis not present

## 2017-08-14 DIAGNOSIS — Z602 Problems related to living alone: Secondary | ICD-10-CM | POA: Diagnosis not present

## 2017-08-14 DIAGNOSIS — M791 Myalgia, unspecified site: Secondary | ICD-10-CM | POA: Diagnosis not present

## 2017-08-14 DIAGNOSIS — E1165 Type 2 diabetes mellitus with hyperglycemia: Secondary | ICD-10-CM | POA: Diagnosis not present

## 2017-08-14 DIAGNOSIS — I129 Hypertensive chronic kidney disease with stage 1 through stage 4 chronic kidney disease, or unspecified chronic kidney disease: Secondary | ICD-10-CM | POA: Diagnosis not present

## 2017-08-14 DIAGNOSIS — Z794 Long term (current) use of insulin: Secondary | ICD-10-CM | POA: Diagnosis not present

## 2017-08-14 DIAGNOSIS — M47816 Spondylosis without myelopathy or radiculopathy, lumbar region: Secondary | ICD-10-CM | POA: Diagnosis not present

## 2017-08-14 DIAGNOSIS — E1122 Type 2 diabetes mellitus with diabetic chronic kidney disease: Secondary | ICD-10-CM | POA: Diagnosis not present

## 2017-08-15 DIAGNOSIS — E1122 Type 2 diabetes mellitus with diabetic chronic kidney disease: Secondary | ICD-10-CM | POA: Diagnosis not present

## 2017-08-15 DIAGNOSIS — M791 Myalgia, unspecified site: Secondary | ICD-10-CM | POA: Diagnosis not present

## 2017-08-15 DIAGNOSIS — Z602 Problems related to living alone: Secondary | ICD-10-CM | POA: Diagnosis not present

## 2017-08-15 DIAGNOSIS — Z9181 History of falling: Secondary | ICD-10-CM | POA: Diagnosis not present

## 2017-08-15 DIAGNOSIS — E1121 Type 2 diabetes mellitus with diabetic nephropathy: Secondary | ICD-10-CM | POA: Diagnosis not present

## 2017-08-15 DIAGNOSIS — M47816 Spondylosis without myelopathy or radiculopathy, lumbar region: Secondary | ICD-10-CM | POA: Diagnosis not present

## 2017-08-15 DIAGNOSIS — J449 Chronic obstructive pulmonary disease, unspecified: Secondary | ICD-10-CM | POA: Diagnosis not present

## 2017-08-15 DIAGNOSIS — E1165 Type 2 diabetes mellitus with hyperglycemia: Secondary | ICD-10-CM | POA: Diagnosis not present

## 2017-08-15 DIAGNOSIS — I129 Hypertensive chronic kidney disease with stage 1 through stage 4 chronic kidney disease, or unspecified chronic kidney disease: Secondary | ICD-10-CM | POA: Diagnosis not present

## 2017-08-15 DIAGNOSIS — Z794 Long term (current) use of insulin: Secondary | ICD-10-CM | POA: Diagnosis not present

## 2017-08-15 DIAGNOSIS — N183 Chronic kidney disease, stage 3 (moderate): Secondary | ICD-10-CM | POA: Diagnosis not present

## 2017-08-15 DIAGNOSIS — E785 Hyperlipidemia, unspecified: Secondary | ICD-10-CM | POA: Diagnosis not present

## 2017-08-16 DIAGNOSIS — M47816 Spondylosis without myelopathy or radiculopathy, lumbar region: Secondary | ICD-10-CM | POA: Diagnosis not present

## 2017-08-16 DIAGNOSIS — Z602 Problems related to living alone: Secondary | ICD-10-CM | POA: Diagnosis not present

## 2017-08-16 DIAGNOSIS — E1122 Type 2 diabetes mellitus with diabetic chronic kidney disease: Secondary | ICD-10-CM | POA: Diagnosis not present

## 2017-08-16 DIAGNOSIS — J449 Chronic obstructive pulmonary disease, unspecified: Secondary | ICD-10-CM | POA: Diagnosis not present

## 2017-08-16 DIAGNOSIS — E785 Hyperlipidemia, unspecified: Secondary | ICD-10-CM | POA: Diagnosis not present

## 2017-08-16 DIAGNOSIS — Z794 Long term (current) use of insulin: Secondary | ICD-10-CM | POA: Diagnosis not present

## 2017-08-16 DIAGNOSIS — E1121 Type 2 diabetes mellitus with diabetic nephropathy: Secondary | ICD-10-CM | POA: Diagnosis not present

## 2017-08-16 DIAGNOSIS — N183 Chronic kidney disease, stage 3 (moderate): Secondary | ICD-10-CM | POA: Diagnosis not present

## 2017-08-16 DIAGNOSIS — I129 Hypertensive chronic kidney disease with stage 1 through stage 4 chronic kidney disease, or unspecified chronic kidney disease: Secondary | ICD-10-CM | POA: Diagnosis not present

## 2017-08-16 DIAGNOSIS — Z9181 History of falling: Secondary | ICD-10-CM | POA: Diagnosis not present

## 2017-08-16 DIAGNOSIS — M791 Myalgia, unspecified site: Secondary | ICD-10-CM | POA: Diagnosis not present

## 2017-08-16 DIAGNOSIS — E1165 Type 2 diabetes mellitus with hyperglycemia: Secondary | ICD-10-CM | POA: Diagnosis not present

## 2017-08-21 DIAGNOSIS — E1165 Type 2 diabetes mellitus with hyperglycemia: Secondary | ICD-10-CM | POA: Diagnosis not present

## 2017-08-21 DIAGNOSIS — E1122 Type 2 diabetes mellitus with diabetic chronic kidney disease: Secondary | ICD-10-CM | POA: Diagnosis not present

## 2017-08-21 DIAGNOSIS — Z9181 History of falling: Secondary | ICD-10-CM | POA: Diagnosis not present

## 2017-08-21 DIAGNOSIS — M47816 Spondylosis without myelopathy or radiculopathy, lumbar region: Secondary | ICD-10-CM | POA: Diagnosis not present

## 2017-08-21 DIAGNOSIS — S0990XA Unspecified injury of head, initial encounter: Secondary | ICD-10-CM | POA: Diagnosis not present

## 2017-08-21 DIAGNOSIS — N183 Chronic kidney disease, stage 3 (moderate): Secondary | ICD-10-CM | POA: Diagnosis not present

## 2017-08-21 DIAGNOSIS — S0083XA Contusion of other part of head, initial encounter: Secondary | ICD-10-CM | POA: Diagnosis not present

## 2017-08-21 DIAGNOSIS — J449 Chronic obstructive pulmonary disease, unspecified: Secondary | ICD-10-CM | POA: Diagnosis not present

## 2017-08-21 DIAGNOSIS — E1121 Type 2 diabetes mellitus with diabetic nephropathy: Secondary | ICD-10-CM | POA: Diagnosis not present

## 2017-08-21 DIAGNOSIS — M545 Low back pain: Secondary | ICD-10-CM | POA: Diagnosis not present

## 2017-08-21 DIAGNOSIS — E785 Hyperlipidemia, unspecified: Secondary | ICD-10-CM | POA: Diagnosis not present

## 2017-08-21 DIAGNOSIS — I129 Hypertensive chronic kidney disease with stage 1 through stage 4 chronic kidney disease, or unspecified chronic kidney disease: Secondary | ICD-10-CM | POA: Diagnosis not present

## 2017-08-21 DIAGNOSIS — M791 Myalgia, unspecified site: Secondary | ICD-10-CM | POA: Diagnosis not present

## 2017-08-21 DIAGNOSIS — Z602 Problems related to living alone: Secondary | ICD-10-CM | POA: Diagnosis not present

## 2017-08-21 DIAGNOSIS — Z794 Long term (current) use of insulin: Secondary | ICD-10-CM | POA: Diagnosis not present

## 2017-08-21 DIAGNOSIS — M533 Sacrococcygeal disorders, not elsewhere classified: Secondary | ICD-10-CM | POA: Diagnosis not present

## 2017-08-23 DIAGNOSIS — R0902 Hypoxemia: Secondary | ICD-10-CM | POA: Diagnosis not present

## 2017-08-24 DIAGNOSIS — M47816 Spondylosis without myelopathy or radiculopathy, lumbar region: Secondary | ICD-10-CM | POA: Diagnosis not present

## 2017-08-24 DIAGNOSIS — E1165 Type 2 diabetes mellitus with hyperglycemia: Secondary | ICD-10-CM | POA: Diagnosis not present

## 2017-08-24 DIAGNOSIS — E1121 Type 2 diabetes mellitus with diabetic nephropathy: Secondary | ICD-10-CM | POA: Diagnosis not present

## 2017-08-24 DIAGNOSIS — M791 Myalgia, unspecified site: Secondary | ICD-10-CM | POA: Diagnosis not present

## 2017-08-24 DIAGNOSIS — I129 Hypertensive chronic kidney disease with stage 1 through stage 4 chronic kidney disease, or unspecified chronic kidney disease: Secondary | ICD-10-CM | POA: Diagnosis not present

## 2017-08-24 DIAGNOSIS — E785 Hyperlipidemia, unspecified: Secondary | ICD-10-CM | POA: Diagnosis not present

## 2017-08-24 DIAGNOSIS — Z9181 History of falling: Secondary | ICD-10-CM | POA: Diagnosis not present

## 2017-08-24 DIAGNOSIS — Z602 Problems related to living alone: Secondary | ICD-10-CM | POA: Diagnosis not present

## 2017-08-24 DIAGNOSIS — J449 Chronic obstructive pulmonary disease, unspecified: Secondary | ICD-10-CM | POA: Diagnosis not present

## 2017-08-24 DIAGNOSIS — N183 Chronic kidney disease, stage 3 (moderate): Secondary | ICD-10-CM | POA: Diagnosis not present

## 2017-08-24 DIAGNOSIS — E1122 Type 2 diabetes mellitus with diabetic chronic kidney disease: Secondary | ICD-10-CM | POA: Diagnosis not present

## 2017-08-24 DIAGNOSIS — Z794 Long term (current) use of insulin: Secondary | ICD-10-CM | POA: Diagnosis not present

## 2017-09-07 DIAGNOSIS — R6889 Other general symptoms and signs: Secondary | ICD-10-CM | POA: Diagnosis not present

## 2017-09-07 DIAGNOSIS — M47816 Spondylosis without myelopathy or radiculopathy, lumbar region: Secondary | ICD-10-CM | POA: Diagnosis not present

## 2017-09-07 DIAGNOSIS — R0902 Hypoxemia: Secondary | ICD-10-CM | POA: Diagnosis not present

## 2017-09-23 DIAGNOSIS — R0902 Hypoxemia: Secondary | ICD-10-CM | POA: Diagnosis not present

## 2017-10-23 DIAGNOSIS — R0902 Hypoxemia: Secondary | ICD-10-CM | POA: Diagnosis not present

## 2017-11-09 DIAGNOSIS — E785 Hyperlipidemia, unspecified: Secondary | ICD-10-CM | POA: Diagnosis not present

## 2017-11-09 DIAGNOSIS — Z139 Encounter for screening, unspecified: Secondary | ICD-10-CM | POA: Diagnosis not present

## 2017-11-09 DIAGNOSIS — J449 Chronic obstructive pulmonary disease, unspecified: Secondary | ICD-10-CM | POA: Diagnosis not present

## 2017-11-09 DIAGNOSIS — E1121 Type 2 diabetes mellitus with diabetic nephropathy: Secondary | ICD-10-CM | POA: Diagnosis not present

## 2017-11-09 DIAGNOSIS — Z9181 History of falling: Secondary | ICD-10-CM | POA: Diagnosis not present

## 2017-11-09 DIAGNOSIS — I10 Essential (primary) hypertension: Secondary | ICD-10-CM | POA: Diagnosis not present

## 2017-11-09 DIAGNOSIS — D72829 Elevated white blood cell count, unspecified: Secondary | ICD-10-CM | POA: Diagnosis not present

## 2017-11-16 DIAGNOSIS — E1165 Type 2 diabetes mellitus with hyperglycemia: Secondary | ICD-10-CM | POA: Diagnosis not present

## 2017-11-16 DIAGNOSIS — R030 Elevated blood-pressure reading, without diagnosis of hypertension: Secondary | ICD-10-CM | POA: Diagnosis not present

## 2017-11-16 DIAGNOSIS — Z794 Long term (current) use of insulin: Secondary | ICD-10-CM | POA: Diagnosis not present

## 2017-11-16 DIAGNOSIS — E1140 Type 2 diabetes mellitus with diabetic neuropathy, unspecified: Secondary | ICD-10-CM | POA: Diagnosis not present

## 2017-11-23 DIAGNOSIS — R0902 Hypoxemia: Secondary | ICD-10-CM | POA: Diagnosis not present

## 2017-12-24 DIAGNOSIS — R0902 Hypoxemia: Secondary | ICD-10-CM | POA: Diagnosis not present

## 2018-01-21 DIAGNOSIS — R0902 Hypoxemia: Secondary | ICD-10-CM | POA: Diagnosis not present

## 2018-02-21 DIAGNOSIS — R0902 Hypoxemia: Secondary | ICD-10-CM | POA: Diagnosis not present

## 2018-03-23 DIAGNOSIS — R0902 Hypoxemia: Secondary | ICD-10-CM | POA: Diagnosis not present

## 2018-04-23 DIAGNOSIS — R0902 Hypoxemia: Secondary | ICD-10-CM | POA: Diagnosis not present

## 2018-05-23 DIAGNOSIS — R0902 Hypoxemia: Secondary | ICD-10-CM | POA: Diagnosis not present

## 2018-06-23 DIAGNOSIS — R0902 Hypoxemia: Secondary | ICD-10-CM | POA: Diagnosis not present

## 2018-07-24 DIAGNOSIS — R0902 Hypoxemia: Secondary | ICD-10-CM | POA: Diagnosis not present

## 2018-08-23 DIAGNOSIS — R0902 Hypoxemia: Secondary | ICD-10-CM | POA: Diagnosis not present

## 2018-09-06 DIAGNOSIS — I10 Essential (primary) hypertension: Secondary | ICD-10-CM | POA: Diagnosis not present

## 2018-09-06 DIAGNOSIS — S90411A Abrasion, right great toe, initial encounter: Secondary | ICD-10-CM | POA: Diagnosis not present

## 2018-09-06 DIAGNOSIS — S0990XA Unspecified injury of head, initial encounter: Secondary | ICD-10-CM | POA: Diagnosis not present

## 2018-09-06 DIAGNOSIS — S80212A Abrasion, left knee, initial encounter: Secondary | ICD-10-CM | POA: Diagnosis not present

## 2018-09-06 DIAGNOSIS — W19XXXA Unspecified fall, initial encounter: Secondary | ICD-10-CM | POA: Diagnosis not present

## 2018-09-06 DIAGNOSIS — R42 Dizziness and giddiness: Secondary | ICD-10-CM | POA: Diagnosis not present

## 2018-09-17 DIAGNOSIS — E785 Hyperlipidemia, unspecified: Secondary | ICD-10-CM | POA: Diagnosis not present

## 2018-09-17 DIAGNOSIS — I10 Essential (primary) hypertension: Secondary | ICD-10-CM | POA: Diagnosis not present

## 2018-09-17 DIAGNOSIS — J449 Chronic obstructive pulmonary disease, unspecified: Secondary | ICD-10-CM | POA: Diagnosis not present

## 2018-09-17 DIAGNOSIS — Z79899 Other long term (current) drug therapy: Secondary | ICD-10-CM | POA: Diagnosis not present

## 2018-09-17 DIAGNOSIS — E1121 Type 2 diabetes mellitus with diabetic nephropathy: Secondary | ICD-10-CM | POA: Diagnosis not present

## 2018-09-17 DIAGNOSIS — Z23 Encounter for immunization: Secondary | ICD-10-CM | POA: Diagnosis not present

## 2018-09-23 DIAGNOSIS — R0902 Hypoxemia: Secondary | ICD-10-CM | POA: Diagnosis not present

## 2018-09-30 DIAGNOSIS — I4510 Unspecified right bundle-branch block: Secondary | ICD-10-CM | POA: Diagnosis not present

## 2018-09-30 DIAGNOSIS — R531 Weakness: Secondary | ICD-10-CM | POA: Diagnosis not present

## 2018-09-30 DIAGNOSIS — R197 Diarrhea, unspecified: Secondary | ICD-10-CM | POA: Diagnosis not present

## 2018-09-30 DIAGNOSIS — E119 Type 2 diabetes mellitus without complications: Secondary | ICD-10-CM | POA: Diagnosis not present

## 2018-09-30 DIAGNOSIS — I10 Essential (primary) hypertension: Secondary | ICD-10-CM | POA: Diagnosis not present

## 2018-09-30 DIAGNOSIS — E1165 Type 2 diabetes mellitus with hyperglycemia: Secondary | ICD-10-CM | POA: Diagnosis not present

## 2018-09-30 DIAGNOSIS — Z743 Need for continuous supervision: Secondary | ICD-10-CM | POA: Diagnosis not present

## 2018-09-30 DIAGNOSIS — I951 Orthostatic hypotension: Secondary | ICD-10-CM | POA: Diagnosis not present

## 2018-09-30 DIAGNOSIS — R112 Nausea with vomiting, unspecified: Secondary | ICD-10-CM | POA: Diagnosis not present

## 2018-10-23 DIAGNOSIS — R0902 Hypoxemia: Secondary | ICD-10-CM | POA: Diagnosis not present

## 2018-12-12 DIAGNOSIS — Z9181 History of falling: Secondary | ICD-10-CM | POA: Diagnosis not present

## 2018-12-12 DIAGNOSIS — E785 Hyperlipidemia, unspecified: Secondary | ICD-10-CM | POA: Diagnosis not present

## 2018-12-12 DIAGNOSIS — E1121 Type 2 diabetes mellitus with diabetic nephropathy: Secondary | ICD-10-CM | POA: Diagnosis not present

## 2018-12-12 DIAGNOSIS — D72829 Elevated white blood cell count, unspecified: Secondary | ICD-10-CM | POA: Diagnosis not present

## 2018-12-12 DIAGNOSIS — J449 Chronic obstructive pulmonary disease, unspecified: Secondary | ICD-10-CM | POA: Diagnosis not present

## 2018-12-12 DIAGNOSIS — I10 Essential (primary) hypertension: Secondary | ICD-10-CM | POA: Diagnosis not present

## 2018-12-24 DIAGNOSIS — R0902 Hypoxemia: Secondary | ICD-10-CM | POA: Diagnosis not present

## 2018-12-26 DIAGNOSIS — E1121 Type 2 diabetes mellitus with diabetic nephropathy: Secondary | ICD-10-CM | POA: Diagnosis not present

## 2019-01-07 DIAGNOSIS — Z79899 Other long term (current) drug therapy: Secondary | ICD-10-CM | POA: Diagnosis not present

## 2019-01-07 DIAGNOSIS — E1165 Type 2 diabetes mellitus with hyperglycemia: Secondary | ICD-10-CM | POA: Diagnosis not present

## 2019-01-07 DIAGNOSIS — R0602 Shortness of breath: Secondary | ICD-10-CM | POA: Diagnosis not present

## 2019-01-07 DIAGNOSIS — R231 Pallor: Secondary | ICD-10-CM | POA: Diagnosis not present

## 2019-01-07 DIAGNOSIS — R0789 Other chest pain: Secondary | ICD-10-CM | POA: Diagnosis not present

## 2019-01-07 DIAGNOSIS — E119 Type 2 diabetes mellitus without complications: Secondary | ICD-10-CM | POA: Diagnosis not present

## 2019-01-07 DIAGNOSIS — R079 Chest pain, unspecified: Secondary | ICD-10-CM | POA: Diagnosis not present

## 2019-01-07 DIAGNOSIS — R000 Tachycardia, unspecified: Secondary | ICD-10-CM | POA: Diagnosis not present

## 2019-01-07 DIAGNOSIS — Z794 Long term (current) use of insulin: Secondary | ICD-10-CM | POA: Diagnosis not present

## 2019-01-07 DIAGNOSIS — J9811 Atelectasis: Secondary | ICD-10-CM | POA: Diagnosis not present

## 2019-01-07 DIAGNOSIS — R51 Headache: Secondary | ICD-10-CM | POA: Diagnosis not present

## 2019-01-07 DIAGNOSIS — J449 Chronic obstructive pulmonary disease, unspecified: Secondary | ICD-10-CM | POA: Diagnosis not present

## 2019-01-07 DIAGNOSIS — Z7984 Long term (current) use of oral hypoglycemic drugs: Secondary | ICD-10-CM | POA: Diagnosis not present

## 2019-01-07 DIAGNOSIS — R1900 Intra-abdominal and pelvic swelling, mass and lump, unspecified site: Secondary | ICD-10-CM | POA: Diagnosis not present

## 2019-01-22 DIAGNOSIS — R0902 Hypoxemia: Secondary | ICD-10-CM | POA: Diagnosis not present

## 2019-01-30 DIAGNOSIS — E1121 Type 2 diabetes mellitus with diabetic nephropathy: Secondary | ICD-10-CM | POA: Diagnosis not present

## 2019-01-30 DIAGNOSIS — E1165 Type 2 diabetes mellitus with hyperglycemia: Secondary | ICD-10-CM | POA: Diagnosis not present

## 2019-02-06 DIAGNOSIS — Z79899 Other long term (current) drug therapy: Secondary | ICD-10-CM | POA: Diagnosis not present

## 2019-02-20 ENCOUNTER — Other Ambulatory Visit: Payer: Self-pay

## 2019-02-20 NOTE — Patient Outreach (Signed)
[facility]) Care Management  02/20/2019  Mellonie Villwock [birth year] [id number]   Medication Adherence call to Mrs. Londa Dipaola [employer] spoke with patient she is due on Simvastatin 5 mg she explain she received a deliver every month from the pharmacy  and her pharmacy calls her every month and double check that she is still taking the same medications patient has received this month medications. Mrs. Bridson is showing past due under [employer].   Lillia Abed CPhT Pharmacy Technician [facility] Management Direct Dial [phone]  Fax [phone] [email or web address]

## 2019-02-21 DIAGNOSIS — Z794 Long term (current) use of insulin: Secondary | ICD-10-CM | POA: Diagnosis not present

## 2019-02-21 DIAGNOSIS — R1900 Intra-abdominal and pelvic swelling, mass and lump, unspecified site: Secondary | ICD-10-CM | POA: Diagnosis not present

## 2019-02-21 DIAGNOSIS — E1165 Type 2 diabetes mellitus with hyperglycemia: Secondary | ICD-10-CM | POA: Diagnosis not present

## 2019-02-21 DIAGNOSIS — E1140 Type 2 diabetes mellitus with diabetic neuropathy, unspecified: Secondary | ICD-10-CM | POA: Diagnosis not present

## 2019-02-21 DIAGNOSIS — E1121 Type 2 diabetes mellitus with diabetic nephropathy: Secondary | ICD-10-CM | POA: Diagnosis not present

## 2019-02-22 DIAGNOSIS — N189 Chronic kidney disease, unspecified: Secondary | ICD-10-CM | POA: Diagnosis not present

## 2019-02-22 DIAGNOSIS — I4510 Unspecified right bundle-branch block: Secondary | ICD-10-CM | POA: Diagnosis not present

## 2019-02-22 DIAGNOSIS — R1900 Intra-abdominal and pelvic swelling, mass and lump, unspecified site: Secondary | ICD-10-CM | POA: Diagnosis not present

## 2019-02-22 DIAGNOSIS — Z794 Long term (current) use of insulin: Secondary | ICD-10-CM | POA: Diagnosis not present

## 2019-02-22 DIAGNOSIS — E1122 Type 2 diabetes mellitus with diabetic chronic kidney disease: Secondary | ICD-10-CM | POA: Diagnosis not present

## 2019-02-22 DIAGNOSIS — R0902 Hypoxemia: Secondary | ICD-10-CM | POA: Diagnosis not present

## 2019-02-27 DIAGNOSIS — D500 Iron deficiency anemia secondary to blood loss (chronic): Secondary | ICD-10-CM | POA: Diagnosis not present

## 2019-03-05 DIAGNOSIS — I4510 Unspecified right bundle-branch block: Secondary | ICD-10-CM | POA: Diagnosis not present

## 2019-03-05 DIAGNOSIS — I214 Non-ST elevation (NSTEMI) myocardial infarction: Secondary | ICD-10-CM | POA: Diagnosis not present

## 2019-03-05 DIAGNOSIS — E1159 Type 2 diabetes mellitus with other circulatory complications: Secondary | ICD-10-CM | POA: Diagnosis not present

## 2019-03-05 DIAGNOSIS — I4891 Unspecified atrial fibrillation: Secondary | ICD-10-CM | POA: Diagnosis not present

## 2019-03-05 DIAGNOSIS — J9600 Acute respiratory failure, unspecified whether with hypoxia or hypercapnia: Secondary | ICD-10-CM | POA: Diagnosis not present

## 2019-03-05 DIAGNOSIS — Z794 Long term (current) use of insulin: Secondary | ICD-10-CM | POA: Diagnosis not present

## 2019-03-05 DIAGNOSIS — E1169 Type 2 diabetes mellitus with other specified complication: Secondary | ICD-10-CM | POA: Diagnosis not present

## 2019-03-05 DIAGNOSIS — R0602 Shortness of breath: Secondary | ICD-10-CM | POA: Diagnosis not present

## 2019-03-05 DIAGNOSIS — J449 Chronic obstructive pulmonary disease, unspecified: Secondary | ICD-10-CM | POA: Diagnosis not present

## 2019-03-05 DIAGNOSIS — J9622 Acute and chronic respiratory failure with hypercapnia: Secondary | ICD-10-CM | POA: Diagnosis not present

## 2019-03-05 DIAGNOSIS — G8918 Other acute postprocedural pain: Secondary | ICD-10-CM | POA: Diagnosis not present

## 2019-03-05 DIAGNOSIS — I10 Essential (primary) hypertension: Secondary | ICD-10-CM | POA: Diagnosis not present

## 2019-03-05 DIAGNOSIS — I97120 Postprocedural cardiac arrest following cardiac surgery: Secondary | ICD-10-CM | POA: Diagnosis not present

## 2019-03-05 DIAGNOSIS — I2582 Chronic total occlusion of coronary artery: Secondary | ICD-10-CM | POA: Diagnosis not present

## 2019-03-05 DIAGNOSIS — R0603 Acute respiratory distress: Secondary | ICD-10-CM | POA: Diagnosis not present

## 2019-03-05 DIAGNOSIS — R570 Cardiogenic shock: Secondary | ICD-10-CM | POA: Diagnosis not present

## 2019-03-05 DIAGNOSIS — R7989 Other specified abnormal findings of blood chemistry: Secondary | ICD-10-CM | POA: Diagnosis not present

## 2019-03-05 DIAGNOSIS — G9341 Metabolic encephalopathy: Secondary | ICD-10-CM | POA: Diagnosis not present

## 2019-03-05 DIAGNOSIS — E1165 Type 2 diabetes mellitus with hyperglycemia: Secondary | ICD-10-CM | POA: Diagnosis not present

## 2019-03-05 DIAGNOSIS — E872 Acidosis: Secondary | ICD-10-CM | POA: Diagnosis not present

## 2019-03-05 DIAGNOSIS — Z7982 Long term (current) use of aspirin: Secondary | ICD-10-CM | POA: Diagnosis not present

## 2019-03-05 DIAGNOSIS — I130 Hypertensive heart and chronic kidney disease with heart failure and stage 1 through stage 4 chronic kidney disease, or unspecified chronic kidney disease: Secondary | ICD-10-CM | POA: Diagnosis not present

## 2019-03-05 DIAGNOSIS — A419 Sepsis, unspecified organism: Secondary | ICD-10-CM | POA: Diagnosis not present

## 2019-03-05 DIAGNOSIS — Z9981 Dependence on supplemental oxygen: Secondary | ICD-10-CM | POA: Diagnosis not present

## 2019-03-05 DIAGNOSIS — E874 Mixed disorder of acid-base balance: Secondary | ICD-10-CM | POA: Diagnosis not present

## 2019-03-05 DIAGNOSIS — I501 Left ventricular failure: Secondary | ICD-10-CM | POA: Diagnosis not present

## 2019-03-05 DIAGNOSIS — I2584 Coronary atherosclerosis due to calcified coronary lesion: Secondary | ICD-10-CM | POA: Diagnosis not present

## 2019-03-05 DIAGNOSIS — I444 Left anterior fascicular block: Secondary | ICD-10-CM | POA: Diagnosis not present

## 2019-03-05 DIAGNOSIS — I6381 Other cerebral infarction due to occlusion or stenosis of small artery: Secondary | ICD-10-CM | POA: Diagnosis not present

## 2019-03-05 DIAGNOSIS — I442 Atrioventricular block, complete: Secondary | ICD-10-CM | POA: Diagnosis not present

## 2019-03-05 DIAGNOSIS — E278 Other specified disorders of adrenal gland: Secondary | ICD-10-CM | POA: Diagnosis not present

## 2019-03-05 DIAGNOSIS — I509 Heart failure, unspecified: Secondary | ICD-10-CM | POA: Diagnosis not present

## 2019-03-05 DIAGNOSIS — D62 Acute posthemorrhagic anemia: Secondary | ICD-10-CM | POA: Diagnosis not present

## 2019-03-05 DIAGNOSIS — R6521 Severe sepsis with septic shock: Secondary | ICD-10-CM | POA: Diagnosis not present

## 2019-03-05 DIAGNOSIS — J189 Pneumonia, unspecified organism: Secondary | ICD-10-CM | POA: Diagnosis not present

## 2019-03-05 DIAGNOSIS — R001 Bradycardia, unspecified: Secondary | ICD-10-CM | POA: Diagnosis not present

## 2019-03-05 DIAGNOSIS — I639 Cerebral infarction, unspecified: Secondary | ICD-10-CM | POA: Diagnosis not present

## 2019-03-05 DIAGNOSIS — R578 Other shock: Secondary | ICD-10-CM | POA: Diagnosis not present

## 2019-03-05 DIAGNOSIS — I471 Supraventricular tachycardia: Secondary | ICD-10-CM | POA: Diagnosis not present

## 2019-03-05 DIAGNOSIS — I452 Bifascicular block: Secondary | ICD-10-CM | POA: Diagnosis not present

## 2019-03-05 DIAGNOSIS — I6389 Other cerebral infarction: Secondary | ICD-10-CM | POA: Diagnosis not present

## 2019-03-05 DIAGNOSIS — D509 Iron deficiency anemia, unspecified: Secondary | ICD-10-CM | POA: Diagnosis not present

## 2019-03-05 DIAGNOSIS — K661 Hemoperitoneum: Secondary | ICD-10-CM | POA: Diagnosis not present

## 2019-03-05 DIAGNOSIS — J9621 Acute and chronic respiratory failure with hypoxia: Secondary | ICD-10-CM | POA: Diagnosis not present

## 2019-03-05 DIAGNOSIS — Z9911 Dependence on respirator [ventilator] status: Secondary | ICD-10-CM | POA: Diagnosis not present

## 2019-03-05 DIAGNOSIS — R40241 Glasgow coma scale score 13-15, unspecified time: Secondary | ICD-10-CM | POA: Diagnosis not present

## 2019-03-05 DIAGNOSIS — N840 Polyp of corpus uteri: Secondary | ICD-10-CM | POA: Diagnosis not present

## 2019-03-05 DIAGNOSIS — I110 Hypertensive heart disease with heart failure: Secondary | ICD-10-CM | POA: Diagnosis not present

## 2019-03-05 DIAGNOSIS — N135 Crossing vessel and stricture of ureter without hydronephrosis: Secondary | ICD-10-CM | POA: Diagnosis not present

## 2019-03-05 DIAGNOSIS — R451 Restlessness and agitation: Secondary | ICD-10-CM | POA: Diagnosis not present

## 2019-03-05 DIAGNOSIS — Z4682 Encounter for fitting and adjustment of non-vascular catheter: Secondary | ICD-10-CM | POA: Diagnosis not present

## 2019-03-05 DIAGNOSIS — Z66 Do not resuscitate: Secondary | ICD-10-CM | POA: Diagnosis not present

## 2019-03-05 DIAGNOSIS — R071 Chest pain on breathing: Secondary | ICD-10-CM | POA: Diagnosis not present

## 2019-03-05 DIAGNOSIS — E119 Type 2 diabetes mellitus without complications: Secondary | ICD-10-CM | POA: Diagnosis not present

## 2019-03-05 DIAGNOSIS — I2510 Atherosclerotic heart disease of native coronary artery without angina pectoris: Secondary | ICD-10-CM | POA: Diagnosis not present

## 2019-03-05 DIAGNOSIS — R1900 Intra-abdominal and pelvic swelling, mass and lump, unspecified site: Secondary | ICD-10-CM | POA: Diagnosis not present

## 2019-03-05 DIAGNOSIS — E1122 Type 2 diabetes mellitus with diabetic chronic kidney disease: Secondary | ICD-10-CM | POA: Diagnosis not present

## 2019-03-05 DIAGNOSIS — R188 Other ascites: Secondary | ICD-10-CM | POA: Diagnosis not present

## 2019-03-05 DIAGNOSIS — J440 Chronic obstructive pulmonary disease with acute lower respiratory infection: Secondary | ICD-10-CM | POA: Diagnosis not present

## 2019-03-05 DIAGNOSIS — Z95811 Presence of heart assist device: Secondary | ICD-10-CM | POA: Diagnosis not present

## 2019-03-05 DIAGNOSIS — I97191 Other postprocedural cardiac functional disturbances following other surgery: Secondary | ICD-10-CM | POA: Diagnosis not present

## 2019-03-05 DIAGNOSIS — J168 Pneumonia due to other specified infectious organisms: Secondary | ICD-10-CM | POA: Diagnosis not present

## 2019-03-05 DIAGNOSIS — N183 Chronic kidney disease, stage 3 (moderate): Secondary | ICD-10-CM | POA: Diagnosis not present

## 2019-03-05 DIAGNOSIS — E538 Deficiency of other specified B group vitamins: Secondary | ICD-10-CM | POA: Diagnosis not present

## 2019-03-05 DIAGNOSIS — R918 Other nonspecific abnormal finding of lung field: Secondary | ICD-10-CM | POA: Diagnosis not present

## 2019-03-05 DIAGNOSIS — R579 Shock, unspecified: Secondary | ICD-10-CM | POA: Diagnosis not present

## 2019-03-05 DIAGNOSIS — I472 Ventricular tachycardia: Secondary | ICD-10-CM | POA: Diagnosis not present

## 2019-03-05 DIAGNOSIS — J810 Acute pulmonary edema: Secondary | ICD-10-CM | POA: Diagnosis not present

## 2019-03-05 DIAGNOSIS — J90 Pleural effusion, not elsewhere classified: Secondary | ICD-10-CM | POA: Diagnosis not present

## 2019-03-24 DIAGNOSIS — R0902 Hypoxemia: Secondary | ICD-10-CM | POA: Diagnosis not present

## 2019-04-01 DEATH — deceased

## 2019-04-26 ENCOUNTER — Other Ambulatory Visit: Payer: Self-pay

## 2019-04-26 NOTE — Patient Outreach (Signed)
[facility]) Care Management  04/26/2019  Angie Wang 24-Aug-1946 [id number]    Section called both numbers listed in the chart and both are invalid.  The message states that the numbers have been changed disconnected or no longer in service.  Plan:  RN Health Coach will mail letter to the patient.   If no response to calls and letter in ten business days [name] will proceed with case closure.    Lazaro Arms RN, BSN, [place] Direct Dial:  [phone]  Fax: [phone]

## 2019-05-06 ENCOUNTER — Other Ambulatory Visit: Payer: Self-pay

## 2019-05-06 NOTE — Patient Outreach (Signed)
[place] [facility]) Care Management  05/06/2019  Angie Wang 03/13/1946 [id number]    [place] notified that the patient is deceased.  It was not noted in the chart.  Plan:  RN Health Coach will close program.  Lazaro Arms RN, BSN, [place] Direct Dial:  [phone]  Fax: [phone]
